# Patient Record
Sex: Female | Born: 1975 | Race: White | Hispanic: No | Marital: Married
Health system: Southern US, Community
[De-identification: ages and names within clinical notes are randomized; demographics above are authoritative.]

## PROBLEM LIST (undated history)

## (undated) DIAGNOSIS — E079 Disorder of thyroid, unspecified: Secondary | ICD-10-CM

## (undated) HISTORY — DX: Disorder of thyroid, unspecified: E07.9

---

## 2008-05-06 ENCOUNTER — Ambulatory Visit (HOSPITAL_BASED_OUTPATIENT_CLINIC_OR_DEPARTMENT_OTHER): Admission: RE | Admit: 2008-05-06 | Discharge: 2008-05-06 | Payer: Self-pay | Admitting: Urology

## 2008-05-06 ENCOUNTER — Encounter (INDEPENDENT_AMBULATORY_CARE_PROVIDER_SITE_OTHER): Payer: Self-pay | Admitting: Urology

## 2010-11-13 NOTE — Op Note (Signed)
Courtney Christensen, Courtney Christensen                ACCOUNT NO.:  [id number]   MEDICAL RECORD NO.:  [id number]          PATIENT TYPE:  AMB   LOCATION:  [facility]                         FACILITY:  [facility]   PHYSICIAN:  Jamison Neighbor, M.D.  DATE OF BIRTH:  11/29/1975   DATE OF PROCEDURE:  05/06/2008  DATE OF DISCHARGE:                               OPERATIVE REPORT   PREOPERATIVE DIAGNOSES:  1. Microscopic hematuria.  2. Possible interstitial cystitis.   POSTOPERATIVE DIAGNOSES:  1. Microscopic hematuria.  2. Possible interstitial cystitis.   PROCEDURE:  Cystoscopy, urethral calibration, hydrodistention of the  bladder, bilateral retrogrades with interpretation, bladder biopsy with  cauterization, Marcaine and Pyridium installation, Marcaine and Kenalog  injection.   SURGEON:  Jamison Neighbor, M.D.   ANESTHESIA:  General.   COMPLICATIONS:  None.   DRAINS:  None.   BRIEF HISTORY:  This 35 year old female has had problems with lower  urinary tract including urgency, frequency and pelvic pain.  The patient  was initially told that she had a small UTI and was given antibiotic  therapy, with no improvement.  The patient still has urgency and  frequency as well as hematuria.  She would like to be evaluated for  interstitial cystitis.   Because of the smoking history, it was felt the patient should undergo  evaluation with cystoscopic examination.  The patient would prefer to  have a hydrodistention.  She will undergo retrogrades to evaluate the  hematuria, bladder biopsies to rule out carcinoma in situ, and  evaluation to determine if she has interstitial cystitis.  She  understands the risks and benefits of the procedure and gave full  informed consent.   PROCEDURE:  After successful induction of general anesthesia, the  patient was placed in the dorsal lithotomy position, prepped with  Betadine, and draped in the usual sterile fashion.  Careful bimanual  examination revealed a normal urethra  with no signs of diverticulum or  suburethral mass.  The urethra was calibrated to 15 [place] with female  urethral sounds, with no signs of stenosis or stricture.  The cystoscope  was inserted.  The bladder was carefully inspected.  Both ureteral  orifices were normal in configuration and location.  Clear urine was  seen to efflux from each.  The bladder was completely unremarkable in  its appearance with the exception that it was perhaps slightly  trabeculated.  She notes that she may have been straining somewhat to  urinate.  There were no tumors or stones seen.  Bilateral retrograde  studies were then performed.  A 5-French ureteral catheter was placed in  the right ureteral orifice and contrast was injected up to the kidney.  The ureter was normal in its appearance with no stricture, obstruction  or filling defect.  The collecting system was also normal with no  filling defect or abnormalities detected.  Drain out films were normal.  A retrograde was done on the opposite side.  The same catheter was  utilized.  Contrast was once again injected and there was once again a  finely-formed collecting system with no evidence of filling  defect,  obstruction, or irregularity of any kind.  Drain out films were also  normal.  Following completion of the bilateral retrogrades, the patient  underwent hydrodistention.  The bladder was distended at a pressure of  100 cm of water for 5 minutes.  When the bladder was drained, there were  no real glomerulations.  There was no ulcer formation.  The bladder  capacity was 800 mL, which while slightly less than the average bladder  capacity of 1150, is better than the average IC bladder capacity of 575.  A biopsy was taken and sent for mast cell analysis.  The biopsy site  will be cauterized.  This will also be used to rule out carcinoma in  situ.  The patient underwent instillation of Marcaine and Pyridium into  the bladder.  Marcaine and Kenalog were  then used for a pudendal block.  The patient tolerated the procedure and was taken to the recovery room  in good condition.  She will be sent home with Lorcet Plus, Pyridium  Plus and doxycycline.  When she returns, we will assess the results of  her hydrodistention.  If the patient has no improvement with the  hydrodistention and if there are no mass cells seen on the biopsy, we  will simply treat her symptomatically and would not consider this to be  a classic case of interstitial cystitis.  If, on the other hand, the  patient does have a response to the hydrodistention or we see  significant mast cells on the biopsy, I would consider this to be a  somewhat atypical case of IC but would treat her in the usual fashion  and would base this decision on the fact that she had slight decrease in  her bladder capacity and the positive response to hydrodistention.  Short of that, however, we will treat this patient simply with  symptomatic therapy such as anticholinergics or urinary analgesics.      Jamison Neighbor, M.D.  Electronically Signed     RJE/MEDQ  D:  05/06/2008  T:  05/06/2008  Job:  [id number]

## 2011-11-16 ENCOUNTER — Encounter (HOSPITAL_BASED_OUTPATIENT_CLINIC_OR_DEPARTMENT_OTHER): Payer: Self-pay | Admitting: *Deleted

## 2011-11-16 ENCOUNTER — Emergency Department (HOSPITAL_BASED_OUTPATIENT_CLINIC_OR_DEPARTMENT_OTHER): Payer: Managed Care, Other (non HMO)

## 2011-11-16 ENCOUNTER — Emergency Department (HOSPITAL_BASED_OUTPATIENT_CLINIC_OR_DEPARTMENT_OTHER)
Admission: EM | Admit: 2011-11-16 | Discharge: 2011-11-17 | Disposition: A | Discharge status: Home / Self Care | Payer: Managed Care, Other (non HMO) | Attending: Emergency Medicine | Admitting: Emergency Medicine

## 2011-11-16 DIAGNOSIS — H53149 Visual discomfort, unspecified: Secondary | ICD-10-CM | POA: Insufficient documentation

## 2011-11-16 DIAGNOSIS — G43909 Migraine, unspecified, not intractable, without status migrainosus: Secondary | ICD-10-CM | POA: Insufficient documentation

## 2011-11-16 DIAGNOSIS — R110 Nausea: Secondary | ICD-10-CM | POA: Insufficient documentation

## 2011-11-16 DIAGNOSIS — E079 Disorder of thyroid, unspecified: Secondary | ICD-10-CM | POA: Insufficient documentation

## 2011-11-16 HISTORY — DX: Disorder of thyroid, unspecified: E07.9

## 2011-11-16 MED ORDER — SODIUM CHLORIDE 0.9 % IV BOLUS (SEPSIS)
1000.0000 mL | Freq: Once | INTRAVENOUS | Status: AC
  Administered 2011-11-16: 1000 mL via INTRAVENOUS

## 2011-11-16 MED ORDER — DEXAMETHASONE SODIUM PHOSPHATE 10 MG/ML IJ SOLN
10.0000 mg | Freq: Once | INTRAMUSCULAR | Status: AC
  Administered 2011-11-16: 10 mg via INTRAVENOUS
  Filled 2011-11-16: qty 1

## 2011-11-16 MED ORDER — METOCLOPRAMIDE HCL 5 MG/ML IJ SOLN
10.0000 mg | Freq: Once | INTRAMUSCULAR | Status: AC
  Administered 2011-11-16: 10 mg via INTRAVENOUS
  Filled 2011-11-16: qty 2

## 2011-11-16 MED ORDER — DIPHENHYDRAMINE HCL 50 MG/ML IJ SOLN
25.0000 mg | Freq: Once | INTRAMUSCULAR | Status: AC
  Administered 2011-11-16: 25 mg via INTRAVENOUS
  Filled 2011-11-16: qty 1

## 2011-11-16 NOTE — ED Notes (Signed)
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

## 2011-11-16 NOTE — ED Notes (Signed)
Vital signs stable. 

## 2011-11-16 NOTE — ED Notes (Signed)
Pt. Reports no visual disturbances at present time.  Pt. Also reports she feels some better.

## 2011-11-16 NOTE — Discharge Instructions (Signed)
Courtney Christensen you had a migraine head ache tonight.  We treated you with IV benadryl, decadron and reglan (migraine cocktail).  Followup with your family doctor to come up with a plan in case the headaches start recurring. Do not drive tonight. Return to the ER for severe pain or stroke symptoms such as weakness on one side. Migraine Headache A migraine is very bad pain on one or both sides of your head. The cause of a migraine is not always known. A migraine can be triggered or caused by different things, such as:  Alcohol.   Smoking.   Stress.   Periods (menstruation) in women.   Aged cheeses.   Foods or drinks that contain nitrates, glutamate, aspartame, or tyramine.   Lack of sleep.   Chocolate.   Caffeine.   Hunger.   Medicines, such as nitroglycerine (used to treat chest pain), birth control pills, estrogen, and some blood pressure medicines.  HOME CARE  Many medicines can help migraine pain or keep migraines from coming back. Your doctor can help you decide on a medicine or treatment program.   If you or your child gets a migraine, it may help to lie down in a dark, quiet room.   Keep a headache journal. This may help find out what is causing the headaches. For example, write down:   What you eat and drink.   How much sleep you get.   Any change to your diet or medicines.  GET HELP RIGHT AWAY IF:   The medicine does not work.   The pain begins again.   The neck is stiff.   You have trouble seeing.   The muscles are weak or you lose muscle control.   You have new symptoms.   You lose your balance.   You have trouble walking.   You feel faint or pass out.  MAKE SURE YOU:   Understand these instructions.   Will watch this condition.   Will get help right away if you are not doing well or get worse.  Document Released: 03/26/2008 Document Revised: 06/06/2011 Document Reviewed: 02/20/2009 [facility] Patient Information 2012 [place], [place].Recurrent  Migraine Headache You have a recurrent migraine headache. The caregiver can usually provide good relief for this headache. If this headache is the same as your previous migraine headaches, it is safe to treat you without repeating a complete evaluation.  These headaches usually have at least two of the following problems:   They occur on one side of the head, pulsate, and are severe enough to prevent daily activities.   They are aggravated by daily physical activities.  You may have one or more of the following symptoms:   Nausea (feeling sick to your stomach).   Vomiting.   Pain with exposure to bright lights or loud noises.  Most headache sufferers have a family history of migraines. Your headaches may also be related to alcohol and smoking habits. Too much sleep, too little sleep, mood, and anxiety may also play a part. Changing some of these triggers may help you lower the number and level of pain of the headaches. Headaches may be related to menses (female menstruation). There are numerous medications that can prevent these headaches. Your caregiver can help you with a medication or regimen (procedure to follow). If this has been a chronic (long-term) condition, the use of long-term narcotics is not recommended. Using long-term narcotics can cause recurrent migraines. Narcotics are only a temporary measure only. They are used for the infrequent  migraine that fails to respond to all other measures. SEEK MEDICAL CARE IF:   You do not get relief from the medications given to you.   You have a recurrence of pain.   This headache begins to differ from past migraine (for example if it is more severe).  SEEK IMMEDIATE MEDICAL CARE IF:  You have a fever.   You have a stiff neck.   You have vision loss or have changes in vision.   You have problems with feeling lightheaded, become faint, or lose your balance.   You have muscular weakness.   You have loss of muscular control.   You  develop severe symptoms different from your first symptoms.   You start losing your balance or have trouble walking.   You feel faint or pass out.  MAKE SURE YOU:   Understand these instructions.   Will watch your condition.   Will get help right away if you are not doing well or get worse.  Document Released: 03/12/2001 Document Revised: 06/06/2011 Document Reviewed: 02/04/2008 [facility] Patient Information 2012 [place], [place].

## 2011-11-16 NOTE — ED Notes (Signed)
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  Pt. LT to take her home is Alfredo Batty [phone]

## 2011-11-16 NOTE — ED Notes (Signed)
Pt. Is actively vomiting at this time and reports feeling better

## 2011-11-16 NOTE — ED Notes (Signed)
Pt with sudden onset of HA and nausea has a hx of migraines. Denies fever or recent illness

## 2011-11-17 NOTE — ED Provider Notes (Signed)
History     CSN: [id number]  Arrival date & time 11/16/11  2012   First MD Initiated Contact with Patient 11/16/11 2133      Chief Complaint  Patient presents with  . Migraine  . Nausea    (Consider location/radiation/quality/duration/timing/severity/associated sxs/prior treatment) Patient is a 36 y.o. female presenting with migraine.  Migraine This is a new problem. The current episode started today. The problem has been unchanged. Associated symptoms include headaches and nausea. Pertinent negatives include no fever, neck pain, numbness, visual change, vomiting or weakness. Exacerbated by: light and sound. She has tried nothing for the symptoms.  Sudden onset of migraine h/a similar to h/a in 2007.  Nausea but no vomiting.  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  Neuro in tact. Photophobia and phonophobia.   Past Medical History  Diagnosis Date  . Migraine   . Thyroid disease     History reviewed. No pertinent past surgical history.  History reviewed. No pertinent family history.  History  Substance Use Topics  . Smoking status: Never Smoker   . Smokeless tobacco: Not on file  . Alcohol Use: No    OB History    Grav Para Term Preterm Abortions TAB SAB Ect Mult Living                  Review of Systems  Constitutional: Negative.  Negative for fever.  HENT: Negative for neck pain.   Eyes: Negative.   Respiratory: Negative.   Cardiovascular: Negative.   Gastrointestinal: Positive for nausea. Negative for vomiting.  Neurological: Positive for headaches. Negative for dizziness, seizures, syncope, speech difficulty, weakness and numbness.  Psychiatric/Behavioral: Negative.   All other systems reviewed and are negative.    Allergies  Review of patient's allergies indicates no known allergies.  Home Medications   Current Outpatient Rx  Name Route Sig Dispense Refill  . CYCLOBENZAPRINE HCL 5 MG PO TABS Oral  Take 5 mg by mouth 3 (three) times daily as needed. For back spasms    . LEVOTHYROXINE SODIUM 50 MCG PO TABS Oral Take 50 mcg by mouth daily.      BP 102/73  Pulse 79  Temp(Src) 97.9 F (36.6 C) (Oral)  Resp 14  Ht 5\' 3"  (1.6 m)  Wt 141 lb (63.957 kg)  BMI 24.98 kg/m2  SpO2 99%  LMP 10/13/2011  Physical Exam  Nursing note and vitals reviewed. Constitutional: She is oriented to person, place, and time. She appears well-developed and well-nourished.  HENT:  Head: Normocephalic.  Eyes: Conjunctivae and EOM are normal. Pupils are equal, round, and reactive to light.  Neck: Normal range of motion. Neck supple.  Cardiovascular: Normal rate.   Pulmonary/Chest: Effort normal.  Abdominal: Soft.  Musculoskeletal: Normal range of motion.  Neurological: She is alert and oriented to person, place, and time. She has normal strength. No cranial nerve deficit or sensory deficit. She displays a negative Romberg sign. GCS eye subscore is 4. GCS verbal subscore is 5. GCS motor subscore is 6.       A & o x 3 MAE=  Normal FOV and coordination.  Skin: Skin is warm and dry.  Psychiatric: She has a normal mood and affect.    ED Course  Procedures (including critical care time)  Labs Reviewed - No data to display Ct Head Wo Contrast  11/16/2011  *RADIOLOGY REPORT*  Clinical Data: Sudden onset headache  CT HEAD WITHOUT CONTRAST  Technique:  Contiguous axial images were obtained from the base of the skull through the vertex without contrast.  Comparison: None.  Findings: There is no evidence for acute hemorrhage, hydrocephalus, mass lesion, or abnormal extra-axial fluid collection.  No definite CT evidence for acute infarction.  Small right maxillary sinus mucous retention cyst.  Otherwise, the visualized paranasal sinuses and mastoid air cells are predominately clear.  IMPRESSION: No acute intracranial abnormality.  Original Report Authenticated By: Waneta Martins, M.D.     1. Migraine        MDM  Migraine h/a treated int he ER with benadryl, decadron and reglan with relief.  CT head unremarkable.  Follow up with pcp next week.  Return if worse.  Neuro intact.         Remi Haggard, NP 11/17/11 1252

## 2011-11-18 NOTE — ED Provider Notes (Signed)
Medical screening examination/treatment/procedure(s) were performed by non-physician practitioner and as supervising physician I was immediately available for consultation/collaboration.   Suzi Roots, MD 11/18/11 [phone]

## 2015-05-04 LAB — TSH: TSH: 2.27 u[IU]/mL (ref 0.41–5.90)

## 2015-09-14 ENCOUNTER — Encounter: Payer: Self-pay | Admitting: *Deleted

## 2015-09-14 ENCOUNTER — Ambulatory Visit (INDEPENDENT_AMBULATORY_CARE_PROVIDER_SITE_OTHER): Payer: Managed Care, Other (non HMO) | Admitting: Endocrinology

## 2015-09-14 ENCOUNTER — Encounter: Payer: Self-pay | Admitting: Endocrinology

## 2015-09-14 VITALS — BP 106/68 | HR 64 | Temp 97.8°F | Resp 14 | Ht 62.5 in | Wt 155.0 lb

## 2015-09-14 DIAGNOSIS — E038 Other specified hypothyroidism: Secondary | ICD-10-CM

## 2015-09-14 DIAGNOSIS — R5383 Other fatigue: Secondary | ICD-10-CM

## 2015-09-14 DIAGNOSIS — E063 Autoimmune thyroiditis: Principal | ICD-10-CM

## 2015-09-14 DIAGNOSIS — N915 Oligomenorrhea, unspecified: Secondary | ICD-10-CM

## 2015-09-14 LAB — CBC WITH DIFFERENTIAL/PLATELET
BASOS ABS: 0 10*3/uL (ref 0.0–0.1)
Basophils Relative: 0.3 % (ref 0.0–3.0)
EOS ABS: 0.2 10*3/uL (ref 0.0–0.7)
Eosinophils Relative: 3.3 % (ref 0.0–5.0)
HEMATOCRIT: 39.9 % (ref 36.0–46.0)
HEMOGLOBIN: 13.3 g/dL (ref 12.0–15.0)
LYMPHS PCT: 31 % (ref 12.0–46.0)
Lymphs Abs: 2.3 10*3/uL (ref 0.7–4.0)
MCHC: 33.3 g/dL (ref 30.0–36.0)
MCV: 88.3 fl (ref 78.0–100.0)
MONOS PCT: 7.4 % (ref 3.0–12.0)
Monocytes Absolute: 0.5 10*3/uL (ref 0.1–1.0)
Neutro Abs: 4.3 10*3/uL (ref 1.4–7.7)
Neutrophils Relative %: 58 % (ref 43.0–77.0)
PLATELETS: 219 10*3/uL (ref 150.0–400.0)
RBC: 4.52 Mil/uL (ref 3.87–5.11)
RDW: 13 % (ref 11.5–15.5)
WBC: 7.4 10*3/uL (ref 4.0–10.5)

## 2015-09-14 LAB — T4, FREE: FREE T4: 1.01 ng/dL (ref 0.60–1.60)

## 2015-09-14 LAB — FOLLICLE STIMULATING HORMONE: FSH: 16.8 m[IU]/mL

## 2015-09-14 LAB — TSH: TSH: 0.96 u[IU]/mL (ref 0.35–4.50)

## 2015-09-14 LAB — VITAMIN B12: Vitamin B-12: 294 pg/mL (ref 211–911)

## 2015-09-14 LAB — T3, FREE: T3 FREE: 3.2 pg/mL (ref 2.3–4.2)

## 2015-09-14 NOTE — Progress Notes (Signed)
Patient ID: Courtney Christensen, female   DOB: 16-Apr-1976, 40 y.o.   MRN: [id number]            Reason for Appointment:  Hypothyroidism, new visit    History of Present Illness:   Hypothyroidism was first diagnosed in 2011  At the time of diagnosis patient was having symptoms of  fatigue, decreased alertness, difficulties with memory, weight gain and some hair loss Although she had gone in for a regular exam only she was told that her blood tests indicated hypothyroidism and was started on 50 g of levothyroxine.  With starting thyroid supplementation the patient's symptoms improved and she was more alert and had improved energy level along with better weight.  She apparently felt fairly good until about middle of 2016 when she was starting to have more symptoms of low energy, sleepiness, difficulty with memory and weight gain as well as hair loss  Since her TSH was 3.27 in 8/16 her dose was increased from 50 up to 75 g; follow-up TSH was 2.27 with normal free T4 She thinks she felt only slightly better with this However with continued symptoms she was told to increase the dose to 88 g about 3 months ago With this she does not feel any better and still continues to have issues with fatigue, sleepiness, some hair loss and decreased memory and alertness and some continued weight gain  She is now coming here for second opinion          Patient's weight history is as follows:  Wt Readings from Last 3 Encounters:  09/14/15 155 lb (70.308 kg)    Thyroid function results have been as follows:  Lab Results  Component Value Date   TSH 2.27 05/04/2015     Past Medical History  Diagnosis Date  . Thyroid disease     History reviewed. No pertinent past surgical history.  Family History  Problem Relation Age of Onset  . Diabetes Mother   . Thyroid disease Mother   . Diabetes Maternal Grandmother   . Thyroid disease Maternal Grandmother     Social History:  reports that she has  quit smoking. She has never used smokeless tobacco. Her alcohol and drug histories are not on file.  Allergies: No Known Allergies    Medication List       This list is accurate as of: 09/14/15  2:52 PM.  Always use your most recent med list.               solifenacin 10 MG tablet  Commonly known as:  VESICARE  Take 10 mg by mouth.     SYNTHROID 88 MCG tablet  Generic drug:  levothyroxine  Take by mouth.        Review of Systems:  Review of Systems  Constitutional: Positive for weight loss and malaise.       25  HENT: Negative for headaches.   Respiratory: Negative for shortness of breath.   Cardiovascular: Negative for leg swelling.  Gastrointestinal: Negative for constipation.  Endocrine: Positive for menstrual changes and cold intolerance. Negative for breast discharge.       Since  about summer of 2016 she has had increasing intervals between her menstrual cycles, generally 6-7 weeks apart.  No hot flashes or sweating episodes. Has had mild chronic cold intolerance, not any worse   Musculoskeletal: Negative for muscle aches.  Skin: Negative for rash.  Neurological: Negative for numbness and tingling.  Psychiatric/Behavioral: Negative for depressed mood and insomnia.  dermoid cyst          Examination:    BP 106/68 mmHg  Pulse 64  Temp(Src) 97.8 F (36.6 C)  Resp 14  Ht 5' 2.5" (1.588 m)  Wt 155 lb (70.308 kg)  BMI 27.88 kg/m2  SpO2 98%  GENERAL:  Average build. She looks well  No pallor, clubbing, lymphadenopathy or edema.  Skin:  no rash or pigmentation.  EYES:  No prominence of the eyes or swelling of the eyelids  ENT: Oral mucosa and tongue normal.  THYROID: Enlarged about 1-1/2-2 times normal, relatively softer and smoother on the right and relatively more irregular on the left without distinct nodules  HEART:  Normal  S1 and S2; no murmur or click.  CHEST:    Lungs: Vescicular breath sounds heard equally.  No crepitations/  wheeze.  ABDOMEN:  No distention.  Liver and spleen not palpable.  No other mass or tenderness.  NEUROLOGICAL: Reflexes are bilaterally normal  at biceps.  JOINTS:  Normal.    Assessment:  HYPOTHYROIDISM, Primary and unknown level of her baseline TSH  She has been given progressively higher doses of Synthroid even though her TSH level has stayed in the normal range She does have a small goiter indicating that she most likely has Hashimoto's thyroiditis.  Even with her TSH being normal at 2.3 on the last labs she continues to complain of fatigue and difficulties with memory Not clear if her symptoms are entirely related to hypothyroidism as she does complain of daytime somnolence also  OLIGOMENORRHEA: Etiology is unclear, she has not had any evaluation for this before   PLAN:   Will check her free T4, T3 and TSH is level again today Consider combination of Synthroid and Cytomel or Armour Thyroid as a trial  Consider doing thyroid ultrasound for her goiter although likely to be Hashimoto's thyroiditis   Will also evaluate her hemoglobin and B12 level as possible cause of her fatigue  Check  FSH  and prolactin to evaluate her oligomenorrhea  Follow-up In 6 weeks   Courtney Christensen 09/14/2015, 2:52 PM

## 2015-09-15 LAB — PROLACTIN: PROLACTIN: 13.8 ng/mL

## 2015-09-26 ENCOUNTER — Other Ambulatory Visit: Payer: Self-pay | Admitting: *Deleted

## 2015-09-26 MED ORDER — LIOTHYRONINE SODIUM 5 MCG PO TABS: 5.0000 ug | ORAL_TABLET | Freq: Every day | ORAL | Status: DC

## 2015-09-26 MED ORDER — LEVOTHYROXINE SODIUM 50 MCG PO TABS: ORAL_TABLET | ORAL | Status: DC

## 2015-10-24 ENCOUNTER — Other Ambulatory Visit (INDEPENDENT_AMBULATORY_CARE_PROVIDER_SITE_OTHER): Payer: Managed Care, Other (non HMO)

## 2015-10-24 DIAGNOSIS — E038 Other specified hypothyroidism: Secondary | ICD-10-CM

## 2015-10-24 DIAGNOSIS — E063 Autoimmune thyroiditis: Principal | ICD-10-CM

## 2015-10-24 LAB — T3, FREE: T3, Free: 3.2 pg/mL (ref 2.3–4.2)

## 2015-10-24 LAB — T4, FREE: Free T4: 0.68 ng/dL (ref 0.60–1.60)

## 2015-10-24 LAB — TSH: TSH: 4.49 u[IU]/mL (ref 0.35–4.50)

## 2015-10-26 ENCOUNTER — Ambulatory Visit (INDEPENDENT_AMBULATORY_CARE_PROVIDER_SITE_OTHER): Payer: Managed Care, Other (non HMO) | Admitting: Endocrinology

## 2015-10-26 ENCOUNTER — Encounter: Payer: Self-pay | Admitting: Endocrinology

## 2015-10-26 VITALS — BP 122/76 | HR 64 | Temp 98.5°F | Resp 14 | Ht 62.5 in | Wt 155.0 lb

## 2015-10-26 DIAGNOSIS — E063 Autoimmune thyroiditis: Principal | ICD-10-CM

## 2015-10-26 DIAGNOSIS — E038 Other specified hypothyroidism: Secondary | ICD-10-CM

## 2015-10-26 MED ORDER — LEVOTHYROXINE SODIUM 125 MCG PO TABS: 62.5000 ug | ORAL_TABLET | Freq: Every day | ORAL | Status: DC

## 2015-10-26 NOTE — Progress Notes (Signed)
Patient ID: Courtney Christensen, female   DOB: 19-Mar-1976, 40 y.o.   MRN: [id number]            Reason for Appointment:  Hypothyroidism, new visit    History of Present Illness:   Hypothyroidism was first diagnosed in 2011  At the time of diagnosis patient was having symptoms of fatigue, decreased alertness, difficulties with memory, weight gain and hair loss She was told that her blood tests indicated hypothyroidism and was started on 50 g of levothyroxine. With starting thyroid supplementation the patient's symptoms improved and she was more alert and had improved energy level along with better weight.  She apparently felt fairly good until about middle of 2016 when she was starting to have more symptoms of low energy, sleepiness, difficulty with memory and weight gain as well as hair loss  Since her TSH was 3.27 in 8/16 her dose was increased from 50 up to 75 g; follow-up TSH was 2.27 with normal free T4 She thinks she felt only slightly better with this However with continued symptoms she was told to increase the dose to 88 g in 11/16   Recent history: She was seen in consultation in 3/17 and despite her normal TSH level she was complaining of fatigue, sleepiness, some hair loss and decreased memory and alertness and some continued weight gain  She was empirically given a trial of Cytomel 5 g daily along with reducing her Synthroid to 50 g. She says that right after her dosage was changed she started feeling improved energy, less sleepiness, better mood, improved alertness and no further weight gain code about         Patient's weight history is as follows:  Wt Readings from Last 3 Encounters:  10/26/15 155 lb (70.308 kg)  09/14/15 155 lb (70.308 kg)    Thyroid function results have been as follows:  Lab Results  Component Value Date   FREET4 0.68 10/24/2015   FREET4 1.01 09/14/2015   TSH 4.49 10/24/2015   TSH 0.96 09/14/2015   TSH 2.27 05/04/2015     Past Medical  History  Diagnosis Date  . Thyroid disease     No past surgical history on file.  Family History  Problem Relation Age of Onset  . Diabetes Mother   . Thyroid disease Mother   . Diabetes Maternal Grandmother   . Thyroid disease Maternal Grandmother     Social History:  reports that she has quit smoking. She has never used smokeless tobacco. Her alcohol and drug histories are not on file.  Allergies: No Known Allergies    Medication List       This list is accurate as of: 10/26/15  9:18 PM.  Always use your most recent med list.               levothyroxine 125 MCG tablet  Commonly known as:  SYNTHROID  Take 0.5 tablets (62.5 mcg total) by mouth daily before breakfast.     liothyronine 5 MCG tablet  Commonly known as:  CYTOMEL  Take 1 tablet (5 mcg total) by mouth daily.     multivitamin with minerals Tabs tablet  Take 1 tablet by mouth daily.     solifenacin 10 MG tablet  Commonly known as:  VESICARE  Take 10 mg by mouth.         Review of Systems  Since  about summer of 2016 she has had increasing intervals between her menstrual cycles, generally 6-7 weeks apart. Prolactin and  FSH are normal         Examination:    BP 122/76 mmHg  Pulse 64  Temp(Src) 98.5 F (36.9 C)  Resp 14  Ht 5' 2.5" (1.588 m)  Wt 155 lb (70.308 kg)  BMI 27.88 kg/m2  SpO2 95%     Assessment:  HYPOTHYROIDISM, Primary and unknown level of her baseline TSH  She has had subjective improvement in her symptoms suggestive of hypothyroidism with using 5 g of Cytomel along with relatively low doses of Synthroid, 50 g Even though she is subjectively doing well her TSH is relatively higher at 4.5 Free T3 is about the same as baseline and free T4 is lower  OLIGOMENORRHEA: Etiology is unclear, she will discuss with gynecologist if menstrual cycles do not improve   PLAN:   Will increase her Synthroid to 62.5 g since her TSH is slightly high and free T4 is low normal She will  follow-up in 3 months    Chayla Shands 10/26/2015, 9:18 PM

## 2016-01-12 ENCOUNTER — Other Ambulatory Visit: Payer: Self-pay | Admitting: Endocrinology

## 2016-01-15 ENCOUNTER — Other Ambulatory Visit: Payer: Self-pay

## 2016-01-15 MED ORDER — LIOTHYRONINE SODIUM 5 MCG PO TABS: 5.0000 ug | ORAL_TABLET | Freq: Every day | ORAL | Status: DC

## 2016-01-16 ENCOUNTER — Other Ambulatory Visit: Payer: Managed Care, Other (non HMO)

## 2016-01-18 ENCOUNTER — Ambulatory Visit: Payer: Managed Care, Other (non HMO) | Admitting: Endocrinology

## 2016-01-30 ENCOUNTER — Other Ambulatory Visit (INDEPENDENT_AMBULATORY_CARE_PROVIDER_SITE_OTHER): Payer: Managed Care, Other (non HMO)

## 2016-01-30 DIAGNOSIS — E038 Other specified hypothyroidism: Secondary | ICD-10-CM

## 2016-01-30 DIAGNOSIS — E063 Autoimmune thyroiditis: Principal | ICD-10-CM

## 2016-01-30 LAB — TSH: TSH: 2.03 u[IU]/mL (ref 0.35–4.50)

## 2016-01-30 LAB — T3, FREE: T3, Free: 3.2 pg/mL (ref 2.3–4.2)

## 2016-01-30 LAB — T4, FREE: Free T4: 0.74 ng/dL (ref 0.60–1.60)

## 2016-01-31 ENCOUNTER — Ambulatory Visit: Payer: Managed Care, Other (non HMO) | Admitting: Endocrinology

## 2016-01-31 ENCOUNTER — Ambulatory Visit (INDEPENDENT_AMBULATORY_CARE_PROVIDER_SITE_OTHER): Payer: Managed Care, Other (non HMO) | Admitting: Endocrinology

## 2016-01-31 ENCOUNTER — Encounter: Payer: Self-pay | Admitting: Endocrinology

## 2016-01-31 VITALS — BP 106/62 | HR 62 | Resp 99 | Ht 62.0 in | Wt 161.0 lb

## 2016-01-31 DIAGNOSIS — E063 Autoimmune thyroiditis: Principal | ICD-10-CM

## 2016-01-31 DIAGNOSIS — E038 Other specified hypothyroidism: Secondary | ICD-10-CM

## 2016-01-31 NOTE — Patient Instructions (Signed)
Same doses

## 2016-01-31 NOTE — Progress Notes (Signed)
Patient ID: Courtney Christensen, female   DOB: Apr 10, 1976, 40 y.o.   MRN: [id number]            Reason for Appointment:  Hypothyroidism, new visit    History of Present Illness:   Hypothyroidism was first diagnosed in 2011  At the time of diagnosis patient was having symptoms of fatigue, decreased alertness, difficulties with memory, weight gain and hair loss She was told that her blood tests indicated hypothyroidism and was started on 50 g of levothyroxine. With starting thyroid supplementation the patient's symptoms improved and she was more alert and had improved energy level along with better weight.  She apparently felt fairly good until about middle of 2016 when she was starting to have more symptoms of low energy, sleepiness, difficulty with memory and weight gain as well as hair loss  Since her TSH was 3.27 in 8/16 her dose was increased from 50 up to 75 g; follow-up TSH was 2.27 with normal free T4 She thinks she felt only slightly better with this However with continued symptoms she was told to increase the dose to 88 g in 11/16   Recent history: She was seen in consultation in 3/17 when despite her normal TSH level she was complaining of fatigue, sleepiness, some hair loss and decreased memory and alertness and some continued weight gain  She was empirically given a trial of Cytomel 5 g daily along with reducing her Synthroid to 50 g. Subsequently she started feeling improved energy, less sleepiness, better mood, improved alertness and no further weight gain code   Since follow-up TSH in 4/16 was 4.5 and free T4 was low-normal her levothyroxine was increased to 62.5 g She thinks she feels a little better with her energy level subsequently also She has gained weight but may have been because of her recent vacation         Patient's weight history is as follows:  Wt Readings from Last 3 Encounters:  01/31/16 161 lb (73 kg)  10/26/15 155 lb (70.3 kg)  09/14/15 155 lb (70.3  kg)    Thyroid function results have been as follows:  Lab Results  Component Value Date   FREET4 0.74 01/30/2016   FREET4 0.68 10/24/2015   FREET4 1.01 09/14/2015   TSH 2.03 01/30/2016   TSH 4.49 10/24/2015   TSH 0.96 09/14/2015     Past Medical History:  Diagnosis Date  . Thyroid disease     No past surgical history on file.  Family History  Problem Relation Age of Onset  . Diabetes Mother   . Thyroid disease Mother   . Diabetes Maternal Grandmother   . Thyroid disease Maternal Grandmother     Social History:  reports that she has quit smoking. She has never used smokeless tobacco. Her alcohol and drug histories are not on file.  Allergies: No Known Allergies    Medication List       Accurate as of 01/31/16  9:41 AM. Always use your most recent med list.          levothyroxine 125 MCG tablet Commonly known as:  SYNTHROID Take 0.5 tablets (62.5 mcg total) by mouth daily before breakfast.   liothyronine 5 MCG tablet Commonly known as:  CYTOMEL Take 1 tablet (5 mcg total) by mouth daily.   multivitamin with minerals Tabs tablet Take 1 tablet by mouth daily.   solifenacin 10 MG tablet Commonly known as:  VESICARE Take 10 mg by mouth.   SUMAtriptan 50 MG tablet  Commonly known as:  IMITREX 1 tablet at headache onset, may repeat once in 2 hours if needed   topiramate 25 MG tablet Commonly known as:  TOPAMAX Take 25-50 mg by mouth.        Review of Systems  Since  about summer of 2016 she Had mild oligomenorrhea with menstrual cycle 6-7 weeks apart However more recently they appear to be only about 30-35 days apart Prolactin and FSH are normal         Examination:    BP 106/62 (BP Location: Left Arm, Patient Position: Sitting, Cuff Size: Normal)   Pulse 62   Resp (!) 99   Ht 5\' 2"  (1.575 m)   Wt 161 lb (73 kg)   BMI 29.45 kg/m   Thyroid not palpable Biceps reflexes appear normal   Assessment:  HYPOTHYROIDISM, Primary   She has  had subjective improvement in her symptoms suggestive of hypothyroidism with using 5 g of Cytomel along with levothyroxine, now taking 62.5 g She feels quite good overall with the changes including on her last visit Her labs are excellent with normal T4 and T3 as well as TSH   OLIGOMENORRHEA: Improving  PLAN:   Continue Synthroid  62.5 g along with Cytomel 5 g Follow-up in 6 months     Janice Bodine 01/31/2016, 9:41 AM

## 2016-02-02 ENCOUNTER — Ambulatory Visit: Payer: Managed Care, Other (non HMO) | Admitting: Endocrinology

## 2016-02-19 ENCOUNTER — Other Ambulatory Visit: Payer: Self-pay | Admitting: Endocrinology

## 2016-05-12 ENCOUNTER — Other Ambulatory Visit: Payer: Self-pay | Admitting: Endocrinology

## 2016-06-20 ENCOUNTER — Other Ambulatory Visit: Payer: Self-pay | Admitting: Endocrinology

## 2016-07-16 ENCOUNTER — Emergency Department (HOSPITAL_COMMUNITY): Payer: Managed Care, Other (non HMO)

## 2016-07-16 ENCOUNTER — Emergency Department (HOSPITAL_COMMUNITY)
Admission: EM | Admit: 2016-07-16 | Discharge: 2016-07-17 | Disposition: A | Discharge status: Home / Self Care | Payer: Managed Care, Other (non HMO) | Attending: Emergency Medicine | Admitting: Emergency Medicine

## 2016-07-16 ENCOUNTER — Encounter (HOSPITAL_COMMUNITY): Payer: Self-pay | Admitting: Emergency Medicine

## 2016-07-16 DIAGNOSIS — W19XXXA Unspecified fall, initial encounter: Secondary | ICD-10-CM

## 2016-07-16 DIAGNOSIS — W108XXA Fall (on) (from) other stairs and steps, initial encounter: Secondary | ICD-10-CM

## 2016-07-16 DIAGNOSIS — Y9301 Activity, walking, marching and hiking: Secondary | ICD-10-CM

## 2016-07-16 DIAGNOSIS — R51 Headache: Secondary | ICD-10-CM

## 2016-07-16 DIAGNOSIS — E039 Hypothyroidism, unspecified: Secondary | ICD-10-CM

## 2016-07-16 DIAGNOSIS — Z79899 Other long term (current) drug therapy: Secondary | ICD-10-CM

## 2016-07-16 DIAGNOSIS — Y999 Unspecified external cause status: Secondary | ICD-10-CM

## 2016-07-16 DIAGNOSIS — M549 Dorsalgia, unspecified: Principal | ICD-10-CM

## 2016-07-16 DIAGNOSIS — Y929 Unspecified place or not applicable: Secondary | ICD-10-CM

## 2016-07-16 NOTE — ED Notes (Addendum)
Pt c/o pain , which is constant. Increasing in severity when moving 10/10 and decreasing with no movements 6/10.  Waiting on MD

## 2016-07-16 NOTE — ED Triage Notes (Signed)
Pt st's she slid and fell down wooden steps yesterday.  Pt c/o pain to right upper back.  Pt also st's she had LOC for short period of time.  Pt c/o headache.

## 2016-07-16 NOTE — ED Provider Notes (Signed)
I saw and evaluated the patient, reviewed the resident's note and I agree with the findings and plan.   EKG Interpretation None     41 year old female here after mechanical 2 days ago. Discharge her head to deny having loss of consciousness. Today she had some intermittent bouts of confusion but no vomiting. She also complains of some right scapular pain. Denies any dyspnea. Suspect concussion will obtain x-rays.   Lorre NickAnthony Deisha Stull, MD 07/16/16 [id number]

## 2016-07-16 NOTE — ED Provider Notes (Signed)
[facility]-EMERGENCY DEPT Provider Note   CSN: [id number] Arrival date & time: 07/16/16  1906     History   Chief Complaint Chief Complaint  Patient presents with  . Fall    HPI Courtney Christensen is a 41 y.o. female.  HPI The patient is a 41 year old female with a medical history of hypothyroidism and migraine who presents after a fall.The patient was walking down the stairs yesterday and slipped hitting her head and tumbling down several stairs. She didn't lose consciousness briefly. Following this fall she had headache back pain and nausea. She did not have any episodes of emesis. She initially took Tylenol for pain control. She went to work today and had intermittent nausea and was told by a coworker that she was acting differently and repeating questions. She continues to have a headache and also endorses 8 out of 10 pain on the right side posteriorly in the midclavicular line slightly inferior to the scapular edge. She denies chest pain or shortness of breath. She denies abdominal pain. She denies diarrhea or constipation. She denies polyuria or dysuria. She denies fevers, night sweats or chills. This fall was mechanical in nature and occurred after her husband put new flooring material on the stairs which made him slippery. She did not have any presyncopal episodes preceding his fall. Past Medical History:  Diagnosis Date  . Thyroid disease     Patient Active Problem List   Diagnosis Date Noted  . 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 Oligomenorrhea 09/14/2015    History reviewed. No pertinent surgical history.  OB History    No data available       Home Medications    Prior to Admission medications   Medication Sig Start Date End Date Taking? Authorizing Provider  levothyroxine (SYNTHROID, LEVOTHROID) 125 MCG tablet TAKE ONE-HALF TABLET BY MOUTH DAILY BEFORE BREAKFAST 06/20/16  Yes Reather Littler, MD  liothyronine (CYTOMEL) 5 MCG tablet Take 1 tablet (5 mcg total) by  mouth daily. 01/15/16   Reather Littler, MD  liothyronine (CYTOMEL) 5 MCG tablet TAKE ONE TABLET BY MOUTH ONCE DAILY 05/13/16   Reather Littler, MD  topiramate (TOPAMAX) 25 MG tablet Take 25-50 mg by mouth. 12/26/15   Historical Provider, MD    Family History Family History  Problem Relation Age of Onset  . Diabetes Mother   . Thyroid disease Mother   . Diabetes Maternal Grandmother   . Thyroid disease Maternal Grandmother     Social History Social History  Substance Use Topics  . Smoking status: Former Games developer  . Smokeless tobacco: Never Used  . Alcohol use Not on file     Allergies   Patient has no known allergies.   Review of Systems Review of Systems  All other systems reviewed and are negative.    Physical Exam Updated Vital Signs BP 101/71   Pulse 61   Temp 98 F (36.7 C) (Oral)   Resp 18   Ht 5\' 2"  (1.575 m)   Wt 72.6 kg   LMP 07/16/2016 (Exact Date)   SpO2 96%   BMI 29.26 kg/m   Physical Exam  Constitutional: She is oriented to person, place, and time. She appears well-developed and well-nourished.  HENT:  Head: Normocephalic and atraumatic.  Eyes: Pupils are equal, round, and reactive to light.  Cardiovascular: Normal rate and regular rhythm.  Exam reveals no friction rub.   No murmur heard. Pulmonary/Chest: Effort normal and breath sounds normal. No respiratory distress. She has no wheezes.  Abdominal: Soft.  Bowel sounds are normal. She exhibits no distension. There is no tenderness.  Musculoskeletal: She exhibits tenderness. She exhibits no edema.  Tenderness on the patient's back on the right side in the midclavicular line just inferior to the scapula and lateral to the vertebral midline.  Neurological: She is alert and oriented to person, place, and time.  No cranial nerve deficit. Strength 5 out of 5 in upper and lower extremity is bilaterally.     ED Treatments / Results  Labs  (all labs ordered are listed, but only abnormal results are  displayed) Labs Reviewed - No data to display  EKG  EKG Interpretation None       Radiology Dg Ribs Bilateral W/chest  Result Date: 07/17/2016 CLINICAL DATA:  41 y/o F; status post fall down 12 stairs with pain in the cysts. EXAM: BILATERAL RIBS AND CHEST - 4+ VIEW COMPARISON:  None. FINDINGS: No fracture or other bone lesions are seen involving the ribs. There is no evidence of pneumothorax or pleural effusion. Both lungs are clear. Heart size and mediastinal contours are within normal limits. IMPRESSION: No acute fracture or dislocation identified. Electronically Signed   By: Mitzi HansenLance  Furusawa-Stratton M.D.   On: 07/17/2016 00:08    Procedures Procedures (including critical care time)  Medications Ordered in ED Medications - No data to display   Initial Impression / Assessment and Plan / ED Course  I have reviewed the triage vital signs and the nursing notes.  Pertinent labs & imaging results that were available during my care of the patient were reviewed by me and considered in my medical decision making (see chart for details).  Clinical Course   Patient presents after mechanical fall with intermittent nausea and headache. She was told by a coworker today that she was repeating questions and not acting herself. Her neurological examination is normal. She also has tenderness to palpation posteriorly on the right side in the midclavicular line inferior to the scapular border and the lateral to the spinous process in the thoracic area. She is afebrile and  unable to stable. She is not requiring pain medication at this time. Because the patient lost consciousness and hit her head we'll proceed with CT head without contrast. Additionally, we will order bilateral diagnostic rib imaging to evaluate for potential fracture. I have also included the chest in these imaging studies to evaluate the sternum anteriorly and posteriorly to evaluate the integrity of the bilateral scapula.Diagnostic  imaging showed no fracture or bone lesion to the ribs. No acute dislocation identified. CT head without contrast shows no acute intracranial under Melody. Patient will be discharged home. We'll recommend ibuprofen and Tylenol as needed for pain.  Final Clinical Impressions(s) / ED Diagnoses   Final diagnoses:  Fall  Fall, initial encounter    New Prescriptions New Prescriptions   No medications on file     Thomasene LotJames Camp Gopal, MD 07/17/16 [phone]

## 2016-07-17 MED ORDER — CYCLOBENZAPRINE HCL 5 MG PO TABS: 5.0000 mg | ORAL_TABLET | Freq: Every evening | ORAL | 0 refills | Status: DC | PRN

## 2016-07-17 NOTE — Discharge Instructions (Signed)
Your x-rays did not show a fracture. Your head CT scan was normal.  For pain continue to use ibuprofen and Tylenol as needed. I suspect you will be extremely sore over the next several days.  If you develop worsening headache, nausea or vomiting please make an appointment with your primary care physician or return to the emergency department.

## 2016-07-29 ENCOUNTER — Other Ambulatory Visit (INDEPENDENT_AMBULATORY_CARE_PROVIDER_SITE_OTHER): Payer: Managed Care, Other (non HMO)

## 2016-07-29 DIAGNOSIS — E063 Autoimmune thyroiditis: Principal | ICD-10-CM

## 2016-07-29 DIAGNOSIS — E038 Other specified hypothyroidism: Secondary | ICD-10-CM

## 2016-07-29 LAB — T4, FREE: Free T4: 0.69 ng/dL (ref 0.60–1.60)

## 2016-07-29 LAB — TSH: TSH: 0.79 u[IU]/mL (ref 0.35–4.50)

## 2016-07-29 LAB — T3, FREE: T3, Free: 3.2 pg/mL (ref 2.3–4.2)

## 2016-07-30 ENCOUNTER — Other Ambulatory Visit: Payer: Managed Care, Other (non HMO)

## 2016-08-02 ENCOUNTER — Ambulatory Visit (INDEPENDENT_AMBULATORY_CARE_PROVIDER_SITE_OTHER): Payer: Managed Care, Other (non HMO) | Admitting: Endocrinology

## 2016-08-02 ENCOUNTER — Telehealth: Payer: Self-pay | Admitting: Endocrinology

## 2016-08-02 ENCOUNTER — Encounter: Payer: Self-pay | Admitting: Endocrinology

## 2016-08-02 VITALS — BP 100/80 | HR 82 | Ht 62.0 in | Wt 159.0 lb

## 2016-08-02 DIAGNOSIS — E038 Other specified hypothyroidism: Secondary | ICD-10-CM

## 2016-08-02 DIAGNOSIS — E063 Autoimmune thyroiditis: Principal | ICD-10-CM

## 2016-08-02 MED ORDER — LEVOTHYROXINE SODIUM 75 MCG PO TABS: 75.0000 ug | ORAL_TABLET | Freq: Every day | ORAL | 3 refills | Status: DC

## 2016-08-02 NOTE — Progress Notes (Signed)
Patient ID: Courtney Christensen, female   DOB: [birth year], 41 y.o.   MRN: [id number]            Reason for Appointment:  Hypothyroidism, new visit    History of Present Illness:   Hypothyroidism was first diagnosed in 2011  At the time of diagnosis patient was having symptoms of fatigue, decreased alertness, difficulties with memory, weight gain and hair loss She was told that her blood tests indicated hypothyroidism and was started on 50 g of levothyroxine. With starting thyroid supplementation the patient's symptoms improved and she was more alert and had improved energy level along with better weight.  She apparently felt fairly good until about middle of 2016 when she was starting to have more symptoms of low energy, sleepiness, difficulty with memory and weight gain as well as hair loss  Since her TSH was 3.27 in 8/16 her dose was increased from 50 up to 75 g; follow-up TSH was 2.27 with normal free T4 She thinks she felt only slightly better with this However with continued symptoms she was told to increase the dose to 88 g in 11/16   Recent history: She was seen in consultation in 3/17 when despite her normal TSH level she was complaining of fatigue, sleepiness, some hair loss and decreased memory and alertness and some continued weight gain  She was empirically given a trial of Cytomel 5 g daily along with reducing her Synthroid to 50 g. Subsequently she started feeling improved energy, less sleepiness, better mood, improved alertness and no further weight gain code   Since follow-up TSH in 4/16 was 4.5 and free T4 was low-normal her levothyroxine was increased to 62.5 g  RECENT history: She does think she feels a little tired recently but she does work long hours and may be not being consistent with exercise No difficulty with sleepiness or decreased alertness She has a pillbox and takes her medication regularly However with the current generic medication she has difficulty  cutting the tablets in half evenly and may not get all of the medication         Patient's weight history is as follows:  Wt Readings from Last 3 Encounters:  08/02/16 159 lb (72.1 kg)  07/16/16 160 lb (72.6 kg)  01/31/16 161 lb (73 kg)    Thyroid function results have been as follows:  Lab Results  Component Value Date   FREET4 0.69 07/29/2016   FREET4 0.74 01/30/2016   FREET4 0.68 10/24/2015   TSH 0.79 07/29/2016   TSH 2.03 01/30/2016   TSH 4.49 10/24/2015     Past Medical History:  Diagnosis Date  . Thyroid disease     No past surgical history on file.  Family History  Problem Relation Age of Onset  . Diabetes Mother   . Thyroid disease Mother   . Diabetes Maternal Grandmother   . Thyroid disease Maternal Grandmother     Social History:  reports that she has quit smoking. She has never used smokeless tobacco. Her alcohol and drug histories are not on file.  Allergies: No Known Allergies  Allergies as of 08/02/2016   No Known Allergies     Medication List       Accurate as of 08/02/16  9:41 AM. Always use your most recent med list.          cyclobenzaprine 5 MG tablet Commonly known as:  FLEXERIL Take 1 tablet (5 mg total) by mouth at bedtime as needed for muscle spasms.  levothyroxine 125 MCG tablet Commonly known as:  SYNTHROID, LEVOTHROID TAKE ONE-HALF TABLET BY MOUTH DAILY BEFORE BREAKFAST   liothyronine 5 MCG tablet Commonly known as:  CYTOMEL Take 1 tablet (5 mcg total) by mouth daily.   liothyronine 5 MCG tablet Commonly known as:  CYTOMEL TAKE ONE TABLET BY MOUTH ONCE DAILY        Review of Systems  Since  about summer of 2016 she Had mild oligomenorrhea with menstrual cycle Longer part More recently they are now 4 weeks apart         Examination:    BP 100/80   Pulse 82   Ht 5\' 2"  (1.575 m)   Wt 159 lb (72.1 kg)   LMP 07/16/2016 (Exact Date)   SpO2 98%   BMI 29.08 kg/m   Exam not  indicated   Assessment:  HYPOTHYROIDISM, Primary   She has had subjective improvement in her symptoms suggestive of hypothyroidism with using 5 g of Cytomel along with levothyroxine, now taking 62.5 g She feels quite good overall and her fatigue at times may be nonspecific related to her work hours or other factors Her labs are excellent with normal T4 and T3 as well as TSH  She has difficulty getting the tablet to cut in half for the Synthroid  OLIGOMENORRHEA: Resolved  PLAN:   Continue Synthroid but will change her regimen to 75 g, 6 days a week Continue Cytomel 5 g, she can take both tablets together Follow-up in 6 months     Nefi Musich 08/02/2016, 9:41 AM

## 2016-08-02 NOTE — Patient Instructions (Signed)
Skip 1 day on 75 dose

## 2016-08-02 NOTE — Telephone Encounter (Signed)
It has been removed.

## 2016-08-02 NOTE — Telephone Encounter (Signed)
Patient asked you to take medication   cyclobenzaprine (FLEXERIL) 5 MG tablet  Off her med list, she no longer take it

## 2016-09-16 ENCOUNTER — Other Ambulatory Visit: Payer: Self-pay | Admitting: Endocrinology

## 2017-01-18 ENCOUNTER — Other Ambulatory Visit: Payer: Self-pay | Admitting: Endocrinology

## 2017-01-21 ENCOUNTER — Other Ambulatory Visit: Payer: Self-pay

## 2017-01-21 MED ORDER — LIOTHYRONINE SODIUM 5 MCG PO TABS: 5.0000 ug | ORAL_TABLET | Freq: Every day | ORAL | 3 refills | Status: DC

## 2017-02-14 ENCOUNTER — Other Ambulatory Visit (INDEPENDENT_AMBULATORY_CARE_PROVIDER_SITE_OTHER): Payer: Managed Care, Other (non HMO)

## 2017-02-14 DIAGNOSIS — E063 Autoimmune thyroiditis: Principal | ICD-10-CM

## 2017-02-14 LAB — T4, FREE: Free T4: 0.9 ng/dL (ref 0.60–1.60)

## 2017-02-14 LAB — TSH: TSH: 0.8 u[IU]/mL (ref 0.35–4.50)

## 2017-02-14 LAB — T3, FREE: T3, Free: 3.1 pg/mL (ref 2.3–4.2)

## 2017-02-17 ENCOUNTER — Other Ambulatory Visit: Payer: Managed Care, Other (non HMO)

## 2017-02-17 NOTE — Progress Notes (Signed)
Patient ID: Courtney Christensen, female   DOB: 05-Jul-1975, 41 y.o.   MRN: [id number]            Reason for Appointment:  Hypothyroidism, new visit    History of Present Illness:   Hypothyroidism was first diagnosed in 2011  At the time of diagnosis patient was having symptoms of fatigue, decreased alertness, difficulties with memory, weight gain and hair loss She was told that her blood tests indicated hypothyroidism and was started on 50 g of levothyroxine. With starting thyroid supplementation the patient's symptoms improved and she was more alert and had improved energy level along with better weight.  She apparently felt fairly good until about middle of 2016 when she was starting to have more symptoms of low energy, sleepiness, difficulty with memory and weight gain as well as hair loss  Since her TSH was 3.27 in 8/16 her dose was increased from 50 up to 75 g; follow-up TSH was 2.27 with normal free T4 She thinks she felt only slightly better with this However with continued symptoms she was told to increase the dose to 88 g in 11/16   She was seen in consultation in 3/17 when despite her normal TSH level she was complaining of fatigue, sleepiness, some hair loss and decreased memory and alertness and some continued weight gain  She has been on Cytomel 5 g daily along with variable doses of levothyroxine since then Subsequently she started feeling improved energy, less sleepiness, better mood, improved alertness and no further weight gain     RECENT history:  Although her labs were fairly good on her last visit she had been complaining of feeling somewhat tired Also because of her difficulty with cutting the 125 g tablet in half she was changed to the 75 g, 6 days a week  She reported that she was feeling more tired and sleepy in the afternoon with the new dosage regimen above for about a month On her own she started taking the levothyroxine 7 days a week along with her  Cytomel Since then she thinks she has started feeling better and his having much more energy  However she also has changed her diet significantly and has been progressively losing weight Recently no complaints of decreased alertness or fatigue She takes her medications consistently at 6 AM  Her thyroid levels are fairly similar to the ones on her last visit         Patient's weight history is as follows:  Wt Readings from Last 3 Encounters:  02/18/17 139 lb 9.6 oz (63.3 kg)  08/02/16 159 lb (72.1 kg)  07/16/16 160 lb (72.6 kg)    Thyroid function results have been as follows:  Lab Results  Component Value Date   FREET4 0.90 02/14/2017   FREET4 0.69 07/29/2016   FREET4 0.74 01/30/2016   TSH 0.80 02/14/2017   TSH 0.79 07/29/2016   TSH 2.03 01/30/2016   Lab Results  Component Value Date   T3FREE 3.1 02/14/2017   T3FREE 3.2 07/29/2016   T3FREE 3.2 01/30/2016     Past Medical History:  Diagnosis Date  . Thyroid disease     No past surgical history on file.  Family History  Problem Relation Age of Onset  . Diabetes Mother   . Thyroid disease Mother   . Diabetes Maternal Grandmother   . Thyroid disease Maternal Grandmother     Social History:  reports that she has been smoking.  She has never used smokeless tobacco. Her  alcohol and drug histories are not on file.  Allergies: No Known Allergies  Allergies as of 02/18/2017   No Known Allergies     Medication List       Accurate as of 02/18/17  8:05 AM. Always use your most recent med list.          cyclobenzaprine 5 MG tablet Commonly known as:  FLEXERIL Take 1 tablet (5 mg total) by mouth at bedtime as needed for muscle spasms.   levothyroxine 75 MCG tablet Commonly known as:  SYNTHROID, LEVOTHROID Take 1 tablet (75 mcg total) by mouth daily before breakfast.   liothyronine 5 MCG tablet Commonly known as:  CYTOMEL Take 1 tablet (5 mcg total) by mouth daily.        Review of Systems  She feels  a little lightheaded around the time of her menstrual cycle, this is not new No change in appetite or nausea, she thinks her blood pressure tends to be low normal during her cycles        Examination:    BP (!) 88/60   Pulse 62   Ht 5\' 2"  (1.575 m)   Wt 139 lb 9.6 oz (63.3 kg)   SpO2 98%   BMI 25.53 kg/m     Assessment:  HYPOTHYROIDISM, Primary   She is doing subjectively very well with treatment of her hypothyroidism with using 5 g of Cytomel along with levothyroxine, now taking 75 g daily  She has lost weight with changes in her diet Has been taking them medication consistently Although she was told to take the 75 g dosage 6 days a week her labs are still about the same as with previous regimen of 62.5 g Her levels are excellent with normal T4 and T3 as well as TSH   Low normal blood pressure: She is not orthostatic and has no other symptoms suggestive of adrenal insufficiency, will continue to follow  Weight loss: This appears to be voluntary and she is subjectively doing well  PLAN:   Continue Synthroid 75 g for now Continue Cytomel 5 g, she can take both tablets together Follow-up in 6 months  Total visit time 15 minutes   Mataeo Ingwersen 02/18/2017, 8:05 AM

## 2017-02-18 ENCOUNTER — Encounter: Payer: Self-pay | Admitting: Endocrinology

## 2017-02-18 ENCOUNTER — Ambulatory Visit (INDEPENDENT_AMBULATORY_CARE_PROVIDER_SITE_OTHER): Payer: Managed Care, Other (non HMO) | Admitting: Endocrinology

## 2017-02-18 VITALS — BP 95/70 | HR 62 | Ht 62.0 in | Wt 139.6 lb

## 2017-02-18 DIAGNOSIS — R634 Abnormal weight loss: Secondary | ICD-10-CM

## 2017-02-18 DIAGNOSIS — E063 Autoimmune thyroiditis: Principal | ICD-10-CM

## 2017-02-18 MED ORDER — LIOTHYRONINE SODIUM 5 MCG PO TABS: 5.0000 ug | ORAL_TABLET | Freq: Every day | ORAL | 2 refills | Status: DC

## 2017-02-20 ENCOUNTER — Ambulatory Visit: Payer: Managed Care, Other (non HMO) | Admitting: Endocrinology

## 2017-03-04 DIAGNOSIS — G43919 Migraine, unspecified, intractable, without status migrainosus: Secondary | ICD-10-CM

## 2017-03-04 DIAGNOSIS — F331 Major depressive disorder, recurrent, moderate: Secondary | ICD-10-CM

## 2017-05-15 ENCOUNTER — Other Ambulatory Visit: Payer: Self-pay

## 2017-05-15 MED ORDER — LEVOTHYROXINE SODIUM 75 MCG PO TABS: 75.0000 ug | ORAL_TABLET | Freq: Every day | ORAL | 3 refills | Status: DC

## 2017-05-15 MED ORDER — LIOTHYRONINE SODIUM 5 MCG PO TABS: 5.0000 ug | ORAL_TABLET | Freq: Every day | ORAL | 2 refills | Status: DC

## 2017-08-19 ENCOUNTER — Other Ambulatory Visit: Payer: Managed Care, Other (non HMO)

## 2017-08-21 ENCOUNTER — Ambulatory Visit: Payer: Managed Care, Other (non HMO) | Admitting: Endocrinology

## 2017-09-08 ENCOUNTER — Other Ambulatory Visit (INDEPENDENT_AMBULATORY_CARE_PROVIDER_SITE_OTHER): Payer: Managed Care, Other (non HMO)

## 2017-09-08 DIAGNOSIS — E063 Autoimmune thyroiditis: Principal | ICD-10-CM

## 2017-09-08 LAB — T4, FREE: FREE T4: 0.72 ng/dL (ref 0.60–1.60)

## 2017-09-08 LAB — TSH: TSH: 2.23 u[IU]/mL (ref 0.35–4.50)

## 2017-09-08 LAB — T3, FREE: T3, Free: 2.9 pg/mL (ref 2.3–4.2)

## 2017-09-09 NOTE — Progress Notes (Signed)
Patient ID: Courtney Christensen, female   DOB: Nov 25, 1975, 42 y.o.   MRN: [id number]            Reason for Appointment:  Hypothyroidism, new visit    History of Present Illness:   Hypothyroidism was first diagnosed in 2011  At the time of diagnosis patient was having symptoms of fatigue, decreased alertness, difficulties with memory, weight gain and hair loss She was told that her blood tests indicated hypothyroidism and was started on 50 g of levothyroxine. With starting thyroid supplementation the patient's symptoms improved and she was more alert and had improved energy level along with better weight.  She apparently felt fairly good until about middle of 2016 when she was starting to have more symptoms of low energy, sleepiness, difficulty with memory and weight gain as well as hair loss  Since her TSH was 3.27 in 8/16 her dose was increased from 50 up to 75 g; follow-up TSH was 2.27 with normal free T4 She thinks she felt only slightly better with this However with continued symptoms she was told to increase the dose to 88 g in 11/16   She was seen in consultation in 3/17 when despite her normal TSH level she was complaining of fatigue, sleepiness, some hair loss and decreased memory and alertness and some continued weight gain  She has been on Cytomel 5 g daily along with variable doses of levothyroxine since then Subsequently she started feeling improved energy, less sleepiness, better mood, improved alertness and no further weight gain     RECENT history:  She is back for her 7163-month visit Has been on a regimen of levothyroxine 75 mcg along with Cytomel 5 mcg daily She generally takes these before breakfast in the morning although over the last month or so may have missed a couple of doses because of family issues However she does not think she has had a decrease in her energy level, concentration or any hair loss She has maintained her weight down with watching her diet  consistently and also being active  Her thyroid levels are in the normal range although slightly lower than before especially free T4         Patient's weight history is as follows:  Wt Readings from Last 3 Encounters:  09/10/17 137 lb (62.1 kg)  02/18/17 139 lb 9.6 oz (63.3 kg)  08/02/16 159 lb (72.1 kg)    Thyroid function results have been as follows:  Lab Results  Component Value Date   FREET4 0.72 09/08/2017   FREET4 0.90 02/14/2017   FREET4 0.69 07/29/2016   TSH 2.23 09/08/2017   TSH 0.80 02/14/2017   TSH 0.79 07/29/2016   Lab Results  Component Value Date   T3FREE 2.9 09/08/2017   T3FREE 3.1 02/14/2017   T3FREE 3.2 07/29/2016     Past Medical History:  Diagnosis Date  . Thyroid disease     History reviewed. No pertinent surgical history.  Family History  Problem Relation Age of Onset  . Diabetes Mother   . Thyroid disease Mother   . Diabetes Maternal Grandmother   . Thyroid disease Maternal Grandmother     Social History:  reports that she has been smoking.  she has never used smokeless tobacco. Her alcohol and drug histories are not on file.  Allergies: No Known Allergies  Allergies as of 09/10/2017   No Known Allergies     Medication List        Accurate as of 09/10/17  9:42  AM. Always use your most recent med list.          cyclobenzaprine 5 MG tablet Commonly known as:  FLEXERIL Take 1 tablet (5 mg total) by mouth at bedtime as needed for muscle spasms.   levothyroxine 75 MCG tablet Commonly known as:  SYNTHROID, LEVOTHROID Take 1 tablet (75 mcg total) daily before breakfast by mouth.   liothyronine 5 MCG tablet Commonly known as:  CYTOMEL Take 1 tablet (5 mcg total) daily by mouth.   sertraline 50 MG tablet Commonly known as:  ZOLOFT Take 1 tablet by mouth daily.        Review of Systems  No change in menstrual cycles        Examination:    BP 104/80 (BP Location: Left Arm, Patient Position: Sitting, Cuff Size: Normal)    Pulse (!) 58   Ht 5\' 2"  (1.575 m)   Wt 137 lb (62.1 kg)   SpO2 95%   BMI 25.06 kg/m   Thyroid nonpalpable Left side biceps reflex appears normal  Assessment:  HYPOTHYROIDISM, Primary   She is on Cytomel and Synthroid She is subjectively doing very well Despite her weight loss over the last year she is still requiring about the same amount of supplementation Thyroid not enlarged Explained to the patient the nature of autoimmune thyroid disease  PLAN:   Continue Synthroid 75 g daily along with 5 mcg of liothyronine in the mornings She will try to be regular with her supplements  Follow-up in 6 months    Onyinyechi Huante 09/10/2017, 9:42 AM

## 2017-09-10 ENCOUNTER — Encounter: Payer: Self-pay | Admitting: Endocrinology

## 2017-09-10 ENCOUNTER — Ambulatory Visit (INDEPENDENT_AMBULATORY_CARE_PROVIDER_SITE_OTHER): Payer: Managed Care, Other (non HMO) | Admitting: Endocrinology

## 2017-09-10 VITALS — BP 104/80 | HR 58 | Ht 62.0 in | Wt 137.0 lb

## 2017-09-10 DIAGNOSIS — E063 Autoimmune thyroiditis: Principal | ICD-10-CM

## 2018-01-27 ENCOUNTER — Telehealth: Payer: Self-pay | Admitting: Emergency Medicine

## 2018-01-27 ENCOUNTER — Other Ambulatory Visit: Payer: Self-pay

## 2018-01-27 MED ORDER — LEVOTHYROXINE SODIUM 75 MCG PO TABS: 75.0000 ug | ORAL_TABLET | Freq: Every day | ORAL | 0 refills | Status: DC

## 2018-01-27 MED ORDER — LIOTHYRONINE SODIUM 5 MCG PO TABS: 5.0000 ug | ORAL_TABLET | Freq: Every day | ORAL | 0 refills | Status: DC

## 2018-01-27 NOTE — Telephone Encounter (Addendum)
Pt called and is going to run out of her prescription for liothyronine (CYTOMEL) 5 MCG tablet and levothyroxine (SYNTHROID, LEVOTHROID) 75 MCG tablet before her appt and would like a refill until then please. Pharmacy is [facility] [place]. Thanks.

## 2018-01-27 NOTE — Telephone Encounter (Signed)
Rx sent 

## 2018-03-05 ENCOUNTER — Other Ambulatory Visit (INDEPENDENT_AMBULATORY_CARE_PROVIDER_SITE_OTHER): Payer: Managed Care, Other (non HMO)

## 2018-03-05 DIAGNOSIS — E063 Autoimmune thyroiditis: Principal | ICD-10-CM

## 2018-03-05 LAB — T3, FREE: T3, Free: 3.3 pg/mL (ref 2.3–4.2)

## 2018-03-05 LAB — T4, FREE: FREE T4: 0.72 ng/dL (ref 0.60–1.60)

## 2018-03-05 LAB — TSH: TSH: 2.51 u[IU]/mL (ref 0.35–4.50)

## 2018-03-10 ENCOUNTER — Other Ambulatory Visit: Payer: Managed Care, Other (non HMO)

## 2018-03-13 ENCOUNTER — Ambulatory Visit: Payer: Managed Care, Other (non HMO) | Admitting: Endocrinology

## 2018-03-13 ENCOUNTER — Encounter: Payer: Self-pay | Admitting: Endocrinology

## 2018-03-13 VITALS — BP 100/64 | HR 61 | Temp 98.7°F | Ht 62.0 in | Wt 142.8 lb

## 2018-03-13 DIAGNOSIS — Z23 Encounter for immunization: Secondary | ICD-10-CM

## 2018-03-13 DIAGNOSIS — E063 Autoimmune thyroiditis: Principal | ICD-10-CM

## 2018-03-13 MED ORDER — LIOTHYRONINE SODIUM 5 MCG PO TABS: 5.0000 ug | ORAL_TABLET | Freq: Every day | ORAL | 1 refills | Status: DC

## 2018-03-13 MED ORDER — LEVOTHYROXINE SODIUM 75 MCG PO TABS
75.0000 ug | ORAL_TABLET | Freq: Every day | ORAL | 1 refills | Status: DC
Start: 2018-03-13 — End: 2018-08-24

## 2018-03-13 NOTE — Progress Notes (Signed)
Patient ID: Courtney Christensen, female   DOB: [birth year], 42 y.o.   MRN: [id number]            Reason for Appointment:  Hypothyroidism, f/u visit    History of Present Illness:   Hypothyroidism was first diagnosed in 2011  At the time of diagnosis patient was having symptoms of fatigue, decreased alertness, difficulties with memory, weight gain and hair loss She was told that her blood tests indicated hypothyroidism and was started on 50 g of levothyroxine. With starting thyroid supplementation the patient's symptoms improved and she was more alert and had improved energy level along with better weight.  She apparently felt fairly good until about middle of 2016 when she was starting to have more symptoms of low energy, sleepiness, difficulty with memory and weight gain as well as hair loss  Since her TSH was 3.27 in 8/16 her dose was increased from 50 up to 75 g; follow-up TSH was 2.27 with normal free T4 She thinks she felt only slightly better with this   She was seen in consultation in 3/17 when despite her normal TSH level she was complaining of fatigue, sleepiness, some hair loss and decreased memory and alertness and some continued weight gain  RECENT history: She has been on Cytomel 5 g daily along with 75 mcg of levothyroxine since then Although generally she has been feeling fairly good recently 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 she has had some more fatigue and weight gain from less activity No change in mood or concentration  She takes her levothyroxine in the morning without food and does that around the same time regardless of whether she is working days or nights  Her thyroid levels are in the normal range and about the same as before   Patient's weight history is as follows:  Wt Readings from Last 3 Encounters:  03/13/18 142 lb 12.8 oz (64.8 kg)  09/10/17 137 lb (62.1 kg)  02/18/17 139 lb 9.6 oz (63.3 kg)    Thyroid function results have been as follows:  Lab  Results  Component Value Date   TSH 2.51 03/05/2018   TSH 2.23 09/08/2017   TSH 0.80 02/14/2017   FREET4 0.72 03/05/2018   FREET4 0.72 09/08/2017   FREET4 0.90 02/14/2017    Lab Results  Component Value Date   T3FREE 3.3 03/05/2018   T3FREE 2.9 09/08/2017   T3FREE 3.1 02/14/2017     Past Medical History:  Diagnosis Date  . Thyroid disease     No past surgical history on file.  Family History  Problem Relation Age of Onset  . Diabetes Mother   . Thyroid disease Mother   . Diabetes Maternal Grandmother   . Thyroid disease Maternal Grandmother     Social History:  reports that she has been smoking. She has never used smokeless tobacco. Her alcohol and drug histories are not on file.  Allergies: No Known Allergies  Allergies as of 03/13/2018   No Known Allergies     Medication List        Accurate as of 03/13/18  8:17 AM. Always use your most recent med list.          buPROPion 150 MG 24 hr tablet Commonly known as:  WELLBUTRIN XL Take 1 tablet by mouth daily.   cyclobenzaprine 5 MG tablet Commonly known as:  FLEXERIL Take 1 tablet (5 mg total) by mouth at bedtime as needed for muscle spasms.   levothyroxine 75 MCG tablet Commonly known  as:  SYNTHROID, LEVOTHROID Take 1 tablet (75 mcg total) by mouth daily before breakfast.   liothyronine 5 MCG tablet Commonly known as:  CYTOMEL Take 1 tablet (5 mcg total) by mouth daily.   sertraline 50 MG tablet Commonly known as:  ZOLOFT Take 1 tablet by mouth daily.        Review of Systems     Examination:    BP 100/64 (BP Location: Left Arm, Patient Position: Sitting, Cuff Size: Normal)   Pulse 61   Temp 98.7 F (37.1 C) (Oral)   Ht 5\' 2"  (1.575 m)   Wt 142 lb 12.8 oz (64.8 kg)   SpO2 98%   BMI 26.12 kg/m   Thyroid just palpable on the right lower lateral pole area, relatively firm and about 1 cm across, felt with difficulty on swallowing Left side is not palpable Biceps reflexes normal No  alopecia Skin normal  Assessment:  HYPOTHYROIDISM, Primary   She is on Cytomel 5 mcg and Synthroid which subjectively has helped her compared to Synthroid alone in the past She is consistent with taking her medication She has had some weight gain and more fatigue because of change in her job situation  On exam there is an indistinct right lower pole firm area palpable, likely to be remnant of her previous small goiter or scar tissue  PLAN:   Continue Synthroid 75 g daily along with 5 mcg of liothyronine in the mornings  Reexamine her in 6 months for any change in the right lobe  90-day prescriptions have been sent  Flu vaccine given  Follow-up in 6 months    Quoc Tome 03/13/2018, 8:17 AM

## 2018-08-23 ENCOUNTER — Other Ambulatory Visit: Payer: Self-pay | Admitting: Endocrinology

## 2018-09-09 ENCOUNTER — Other Ambulatory Visit: Payer: Self-pay

## 2018-09-09 ENCOUNTER — Other Ambulatory Visit (INDEPENDENT_AMBULATORY_CARE_PROVIDER_SITE_OTHER): Payer: Managed Care, Other (non HMO)

## 2018-09-09 DIAGNOSIS — E063 Autoimmune thyroiditis: Principal | ICD-10-CM

## 2018-09-09 LAB — T4, FREE: FREE T4: 0.65 ng/dL (ref 0.60–1.60)

## 2018-09-09 LAB — T3, FREE: T3 FREE: 3.1 pg/mL (ref 2.3–4.2)

## 2018-09-09 LAB — TSH: TSH: 5.7 u[IU]/mL — AB (ref 0.35–4.50)

## 2018-09-11 ENCOUNTER — Ambulatory Visit: Payer: Managed Care, Other (non HMO) | Admitting: Endocrinology

## 2018-09-11 ENCOUNTER — Encounter: Payer: Self-pay | Admitting: Endocrinology

## 2018-09-11 ENCOUNTER — Other Ambulatory Visit: Payer: Self-pay

## 2018-09-11 VITALS — BP 112/74 | HR 84 | Temp 98.6°F | Ht 62.0 in | Wt 154.6 lb

## 2018-09-11 DIAGNOSIS — E063 Autoimmune thyroiditis: Principal | ICD-10-CM

## 2018-09-11 MED ORDER — LEVOTHYROXINE SODIUM 88 MCG PO TABS: 88.0000 ug | ORAL_TABLET | Freq: Every day | ORAL | 3 refills | Status: DC

## 2018-09-11 NOTE — Progress Notes (Signed)
Patient ID: Courtney Christensen, female   DOB: 03/20/1976, 43 y.o.   MRN: [id number]            Reason for Appointment:  Hypothyroidism, f/u visit    History of Present Illness:   Hypothyroidism was first diagnosed in 2011  At the time of diagnosis patient was having symptoms of fatigue, decreased alertness, difficulties with memory, weight gain and hair loss She was told that her blood tests indicated hypothyroidism and was started on 50 g of levothyroxine. With starting thyroid supplementation the patient's symptoms improved and she was more alert and had improved energy level along with better weight.  She apparently felt fairly good until about middle of 2016 when she was starting to have more symptoms of low energy, sleepiness, difficulty with memory and weight gain as well as hair loss  Since her TSH was 3.27 in 8/16 her dose was increased from 50 up to 75 g; follow-up TSH was 2.27 with normal free T4 She thinks she felt only slightly better with this   She was seen in consultation in 3/17 when despite her normal TSH level she was complaining of fatigue, sleepiness, some hair loss and decreased memory and alertness and some continued weight gain  RECENT history: She has been on Cytomel 5 g daily along with 75 mcg of levothyroxine since 3/17 She was last seen 6 months ago  She appears to be progressively gaining weight She thinks that this is despite her trying to go to the gym to exercise 3 to 4 days a week now No change in other medications She does think that she feels a little more tired and definitely appears to be colder at work  She takes her levothyroxine in the morning before breakfast and no calcium or iron at the same time  Her TSH is higher and free T4 is relatively lower than before   Patient's weight history is as follows:  Wt Readings from Last 3 Encounters:  09/11/18 154 lb 9.6 oz (70.1 kg)  03/13/18 142 lb 12.8 oz (64.8 kg)  09/10/17 137 lb (62.1 kg)     Thyroid function results have been as follows:  Lab Results  Component Value Date   TSH 5.70 (H) 09/09/2018   TSH 2.51 03/05/2018   TSH 2.23 09/08/2017   FREET4 0.65 09/09/2018   FREET4 0.72 03/05/2018   FREET4 0.72 09/08/2017    Lab Results  Component Value Date   T3FREE 3.1 09/09/2018   T3FREE 3.3 03/05/2018   T3FREE 2.9 09/08/2017     Past Medical History:  Diagnosis Date  . Thyroid disease     History reviewed. No pertinent surgical history.  Family History  Problem Relation Age of Onset  . Diabetes Mother   . Thyroid disease Mother   . Diabetes Maternal Grandmother   . Thyroid disease Maternal Grandmother     Social History:  reports that she has been smoking. She has never used smokeless tobacco. No history on file for alcohol and drug.  Allergies: No Known Allergies  Allergies as of 09/11/2018   No Known Allergies     Medication List       Accurate as of September 11, 2018  8:03 AM. Always use your most recent med list.        levothyroxine 75 MCG tablet Commonly known as:  SYNTHROID, LEVOTHROID TAKE 1 TABLET BY MOUTH DAILY BEFORE BREAKFAST   liothyronine 5 MCG tablet Commonly known as:  CYTOMEL TAKE 1 TABLET BY MOUTH  DAILY   sertraline 50 MG tablet Commonly known as:  ZOLOFT Take 1 tablet by mouth daily.        Review of Systems  Has been on sertraline since at least 2019   Examination:    BP 112/74 (BP Location: Left Arm, Patient Position: Sitting, Cuff Size: Normal)   Pulse 84   Temp 98.6 F (37 C) (Oral)   Ht 5\' 2"  (1.575 m)   Wt 154 lb 9.6 oz (70.1 kg)   SpO2 98%   BMI 28.28 kg/m   She does not look cushingoid  Thyroid right lobe previous barely palpable on swallowing slightly firm without any distinct nodule No neck lymphadenopathy Left side is not palpable Biceps reflexes normal Skin appears normal  Assessment:  HYPOTHYROIDISM, Primary   She is on Cytomel 5 mcg and Synthroid 75 Although she had done well with  this before she appears to be getting more hypothyroid She has mild symptoms along with TSH of 5.7 Free T4 is low normal  Again having some progressive weight gain and not clear if this is related to her sedentary job  Minimal thyroid enlargement present PLAN:   She will now take 88 mcg levothyroxine same dose of Cytomel Follow-up in 2 months     [name] 09/11/2018, 8:03 AM

## 2018-11-09 ENCOUNTER — Other Ambulatory Visit (INDEPENDENT_AMBULATORY_CARE_PROVIDER_SITE_OTHER): Payer: Managed Care, Other (non HMO)

## 2018-11-09 ENCOUNTER — Ambulatory Visit: Payer: Managed Care, Other (non HMO) | Admitting: Endocrinology

## 2018-11-09 ENCOUNTER — Other Ambulatory Visit: Payer: Self-pay

## 2018-11-09 DIAGNOSIS — E063 Autoimmune thyroiditis: Secondary | ICD-10-CM

## 2018-11-09 LAB — T4, FREE: Free T4: 0.81 ng/dL (ref 0.60–1.60)

## 2018-11-09 LAB — T3, FREE: T3, Free: 3 pg/mL (ref 2.3–4.2)

## 2018-11-09 LAB — TSH: TSH: 0.72 u[IU]/mL (ref 0.35–4.50)

## 2018-11-10 ENCOUNTER — Telehealth: Payer: Self-pay | Admitting: Endocrinology

## 2018-11-10 NOTE — Progress Notes (Signed)
Patient ID: Courtney Christensen, female   DOB: 12-29-1975, 43 y.o.   MRN: [id number]            Reason for Appointment:  Hypothyroidism, f/u visit   Today's office visit was provided via telemedicine using video technique Explained to the patient and the the limitations of evaluation and management by telemedicine and the availability of in person appointments.  The patient understood the limitations and agreed to proceed. Patient also understood that the telehealth visit is billable. . Location of the patient: Home . Location of the provider: Office Only the patient and myself were participating in the encounter     History of Present Illness:   Hypothyroidism was first diagnosed in 2011  At the time of diagnosis patient was having symptoms of fatigue, decreased alertness, difficulties with memory, weight gain and hair loss She was told that her blood tests indicated hypothyroidism and was started on 50 g of levothyroxine. With starting thyroid supplementation the patient's symptoms improved and she was more alert and had improved energy level along with better weight.  She apparently felt fairly good until about middle of 2016 when she was starting to have more symptoms of low energy, sleepiness, difficulty with memory and weight gain as well as hair loss  Since her TSH was 3.27 in 8/16 her dose was increased from 50 up to 75 g; follow-up TSH was 2.27 with normal free T4 She thinks she felt only slightly better with this   She was seen in consultation in 3/17 when despite her normal TSH level she was complaining of fatigue, sleepiness, some hair loss and decreased memory and alertness and some continued weight gain  RECENT history: She had been on Cytomel 5 g daily along with 75 mcg of levothyroxine since 3/17 She was last seen 2 months ago  At that time she was having progressive weight gain despite exercising more and a little more fatigue along with new cold intolerance Because  of her TSH being higher at 5.7 her levothyroxine was increased up to 88 mcg; on the last visit free T3 had been stable  She again complains of significant fatigue and general tiredness Although she thinks she is gaining weight her weight is still about 154 at home he still has some cold intolerance compared to other people She is trying to exercise as much as possible although not able to go to the gym currently  She takes her levothyroxine and liothyronine in the morning before breakfast and no calcium or iron at the same time  Her TSH is normal at 0.70 with improved free T4   Patient's weight history is as follows:  Wt Readings from Last 3 Encounters:  09/11/18 154 lb 9.6 oz (70.1 kg)  03/13/18 142 lb 12.8 oz (64.8 kg)  09/10/17 137 lb (62.1 kg)    Thyroid function results have been as follows:  Lab Results  Component Value Date   TSH 0.72 11/09/2018   TSH 5.70 (H) 09/09/2018   TSH 2.51 03/05/2018   FREET4 0.81 11/09/2018   FREET4 0.65 09/09/2018   FREET4 0.72 03/05/2018    Lab Results  Component Value Date   T3FREE 3.0 11/09/2018   T3FREE 3.1 09/09/2018   T3FREE 3.3 03/05/2018     Past Medical History:  Diagnosis Date  . Thyroid disease     No past surgical history on file.  Family History  Problem Relation Age of Onset  . Diabetes Mother   . Thyroid disease Mother   .  Diabetes Maternal Grandmother   . Thyroid disease Maternal Grandmother     Social History:  reports that she has been smoking. She has never used smokeless tobacco. No history on file for alcohol and drug.  Allergies: No Known Allergies  Allergies as of 11/11/2018   No Known Allergies     Medication List       Accurate as of Nov 10, 2018  9:24 PM. If you have any questions, ask your nurse or doctor.        levothyroxine 88 MCG tablet Commonly known as:  SYNTHROID Take 1 tablet (88 mcg total) by mouth daily.   liothyronine 5 MCG tablet Commonly known as:  CYTOMEL TAKE 1 TABLET  BY MOUTH DAILY   sertraline 50 MG tablet Commonly known as:  ZOLOFT Take 1 tablet by mouth daily.        Review of Systems  Has been on sertraline since at least 2019 She said that she is having difficulty adapting to her work schedule   Examination:    There were no vitals taken for this visit.  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  Assessment:  HYPOTHYROIDISM, Primary   She is has been on Cytomel 5 mcg and Synthroid 88 mcg since 3/20 Even with increasing her levothyroxine and normalizing her TSH she complains of significant fatigue Likely has issues with depression or insomnia causing her continued fatigue and difficulty losing weight Unlikely that her cold intolerance which is mild and stable and related  Since she has been compliant with her regimen and has normal thyroid levels will not need to change her regimen  Discussed that she needs to follow-up with her PCP regarding fatigue, sleep issues and discussing her treatment with sertraline  Again encouraged her to exercise as regularly as possible  PLAN:   No change in above regimen Take her levothyroxine and liothyronine with water only in the morning before eating breakfast  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

## 2018-11-10 NOTE — Telephone Encounter (Signed)
Patient returning call in regards to going over medications for appointment.

## 2018-11-11 ENCOUNTER — Encounter: Payer: Self-pay | Admitting: Endocrinology

## 2018-11-11 ENCOUNTER — Encounter (HOSPITAL_BASED_OUTPATIENT_CLINIC_OR_DEPARTMENT_OTHER): Payer: Self-pay | Admitting: *Deleted

## 2018-11-11 ENCOUNTER — Ambulatory Visit (INDEPENDENT_AMBULATORY_CARE_PROVIDER_SITE_OTHER): Payer: Managed Care, Other (non HMO) | Admitting: Endocrinology

## 2018-11-11 DIAGNOSIS — L659 Nonscarring hair loss, unspecified: Secondary | ICD-10-CM

## 2018-11-11 DIAGNOSIS — E063 Autoimmune thyroiditis: Secondary | ICD-10-CM

## 2018-11-11 NOTE — Telephone Encounter (Signed)
Called pt back and completed pre rooming process.

## 2018-12-02 ENCOUNTER — Other Ambulatory Visit: Payer: Self-pay

## 2018-12-02 MED ORDER — LEVOTHYROXINE SODIUM 88 MCG PO TABS: 88.0000 ug | ORAL_TABLET | Freq: Every day | ORAL | 0 refills | Status: DC

## 2019-03-15 ENCOUNTER — Other Ambulatory Visit: Payer: Managed Care, Other (non HMO)

## 2019-03-16 ENCOUNTER — Other Ambulatory Visit: Payer: Managed Care, Other (non HMO)

## 2019-03-16 ENCOUNTER — Other Ambulatory Visit: Payer: Self-pay

## 2019-03-16 DIAGNOSIS — E063 Autoimmune thyroiditis: Secondary | ICD-10-CM

## 2019-03-16 DIAGNOSIS — L659 Nonscarring hair loss, unspecified: Secondary | ICD-10-CM

## 2019-03-16 LAB — T4, FREE: Free T4: 0.83 ng/dL (ref 0.60–1.60)

## 2019-03-16 LAB — TSH: TSH: 0.95 u[IU]/mL (ref 0.35–4.50)

## 2019-03-16 LAB — T3, FREE: T3, Free: 3.2 pg/mL (ref 2.3–4.2)

## 2019-03-18 ENCOUNTER — Other Ambulatory Visit: Payer: Self-pay

## 2019-03-18 ENCOUNTER — Ambulatory Visit (INDEPENDENT_AMBULATORY_CARE_PROVIDER_SITE_OTHER): Payer: Managed Care, Other (non HMO) | Admitting: Endocrinology

## 2019-03-18 ENCOUNTER — Ambulatory Visit: Payer: Managed Care, Other (non HMO) | Admitting: Endocrinology

## 2019-03-18 DIAGNOSIS — E063 Autoimmune thyroiditis: Secondary | ICD-10-CM

## 2019-03-18 MED ORDER — LEVOTHYROXINE SODIUM 88 MCG PO TABS: 88.0000 ug | ORAL_TABLET | Freq: Every day | ORAL | 3 refills | Status: DC

## 2019-03-18 MED ORDER — LIOTHYRONINE SODIUM 5 MCG PO TABS: 5.0000 ug | ORAL_TABLET | Freq: Every day | ORAL | 3 refills | Status: DC

## 2019-03-18 NOTE — Progress Notes (Signed)
Patient ID: Courtney Christensen, female   DOB: [birth year], 43 y.o.   MRN: [id number]            Reason for Appointment:  Hypothyroidism, f/u visit   Today's office visit was provided via telemedicine using video technique Explained to the patient and the the limitations of evaluation and management by telemedicine and the availability of in person appointments.  The patient understood the limitations and agreed to proceed. Patient also understood that the telehealth visit is billable. . Location of the patient: Home . Location of the provider: Office Only the patient and myself were participating in the encounter     History of Present Illness:   Hypothyroidism was first diagnosed in 2011  At the time of diagnosis patient was having symptoms of fatigue, decreased alertness, difficulties with memory, weight gain and hair loss She was told that her blood tests indicated hypothyroidism and was started on 50 g of levothyroxine. With starting thyroid supplementation the patient's symptoms improved and she was more alert and had improved energy level along with better weight.  She apparently felt fairly good until about middle of 2016 when she was starting to have more symptoms of low energy, sleepiness, difficulty with memory and weight gain as well as hair loss  Since her TSH was 3.27 in 8/16 her dose was increased from 50 up to 75 g; follow-up TSH was 2.27 with normal free T4 She thinks she felt only slightly better with this   She was seen in consultation in 3/17 when despite her normal TSH level she was complaining of fatigue, sleepiness, some hair loss and decreased memory and alertness and some continued weight gain  RECENT history: She had been on Cytomel 5 g daily along with 75 mcg of levothyroxine since 3/17  In 08/2018 she was having progressive weight gain despite exercising more and a little more fatigue along with new cold intolerance Because of her TSH being higher at 5.7  with normal T3 her levothyroxine was increased to 88 mcg However she did not appear to have any improvement in her fatigue with the dosage change  She was told to discuss her fatigue with her PCP as it was likely related to her insomnia and stress Her labs were normal in 5/20 and again normal this month  She says her fatigue has improved significantly, possibly from better sleep and less stress She is trying to be as active as possible although not consistently  She takes her levothyroxine and liothyronine in the morning before breakfast and no calcium or iron at the same time  Her TSH is consistently normal at 0.95 and free T4 is stable along with T3   Patient's weight history is as follows:  Wt Readings from Last 3 Encounters:  09/11/18 154 lb 9.6 oz (70.1 kg)  03/13/18 142 lb 12.8 oz (64.8 kg)  09/10/17 137 lb (62.1 kg)    Thyroid function results have been as follows:  Lab Results  Component Value Date   TSH 0.95 03/16/2019   TSH 0.72 11/09/2018   TSH 5.70 (H) 09/09/2018   FREET4 0.83 03/16/2019   FREET4 0.81 11/09/2018   FREET4 0.65 09/09/2018    Lab Results  Component Value Date   T3FREE 3.2 03/16/2019   T3FREE 3.0 11/09/2018   T3FREE 3.1 09/09/2018     Past Medical History:  Diagnosis Date  . Migraine   . Thyroid disease     No past surgical history on file.  Family History  Problem Relation Age of Onset  . Diabetes Mother   . Thyroid disease Mother   . Diabetes Maternal Grandmother   . Thyroid disease Maternal Grandmother     Social History:  reports that she has been smoking. She has never used smokeless tobacco. No history on file for alcohol and drug.  Allergies: No Known Allergies  Allergies as of 03/18/2019   No Known Allergies     Medication List       Accurate as of March 18, 2019  1:12 PM. If you have any questions, ask your nurse or doctor.        cyclobenzaprine 5 MG tablet Commonly known as: FLEXERIL Take 5 mg by mouth 3  (three) times daily as needed. For back spasms   levothyroxine 50 MCG tablet Commonly known as: SYNTHROID Take 50 mcg by mouth daily.   levothyroxine 88 MCG tablet Commonly known as: SYNTHROID Take 1 tablet (88 mcg total) by mouth daily.   liothyronine 5 MCG tablet Commonly known as: CYTOMEL TAKE 1 TABLET BY MOUTH DAILY   sertraline 50 MG tablet Commonly known as: ZOLOFT Take 1 tablet by mouth daily.        Review of Systems  Has been on sertraline since at least 2019   She is not complaining of hair loss  now Has regular menstrual cycles   Examination:    There were no vitals taken for this visit.  No facial puffiness seen on video images  Assessment:  HYPOTHYROIDISM, Primary   She is has been on Cytomel 5 mcg and Synthroid 88 mcg since 3/20  She is very consistent with her levothyroxine and Cytomel every morning  Her fatigue appears to have resolved and again unrelated to her hypothyroidism  Lab is excellent and stable with no abnormal values on her thyroid functions including TSH  PLAN:   No change in above regimen Continue to take her supplements without any interacting vitamins or supplements at the same time 90-day prescription sent She will follow-up in 6 months now  Reather LittlerAjay Dequita Schleicher 03/18/2019, 1:12 PM

## 2019-03-19 LAB — TESTOSTERONE, FREE, TOTAL, SHBG
Sex Hormone Binding: 63.6 nmol/L (ref 24.6–122.0)
Testosterone, Free: 1.4 pg/mL (ref 0.0–4.2)
Testosterone: 13 ng/dL (ref 8–48)

## 2019-07-19 ENCOUNTER — Other Ambulatory Visit: Payer: Managed Care, Other (non HMO)

## 2019-07-22 ENCOUNTER — Ambulatory Visit: Payer: Managed Care, Other (non HMO) | Admitting: Endocrinology

## 2019-09-08 ENCOUNTER — Other Ambulatory Visit: Payer: Self-pay

## 2019-09-08 ENCOUNTER — Other Ambulatory Visit (INDEPENDENT_AMBULATORY_CARE_PROVIDER_SITE_OTHER): Payer: Managed Care, Other (non HMO)

## 2019-09-08 DIAGNOSIS — E063 Autoimmune thyroiditis: Secondary | ICD-10-CM | POA: Diagnosis not present

## 2019-09-08 LAB — TSH: TSH: 1.84 u[IU]/mL (ref 0.35–4.50)

## 2019-09-08 LAB — T3, FREE: T3, Free: 3.3 pg/mL (ref 2.3–4.2)

## 2019-09-08 LAB — T4, FREE: Free T4: 1.03 ng/dL (ref 0.60–1.60)

## 2019-09-15 NOTE — Progress Notes (Signed)
Patient ID: Courtney Christensen, female   DOB: 11/23/1975, 44 y.o.   MRN: [id number]            Reason for Appointment:  Hypothyroidism, f/u visit   Today's office visit was provided via telemedicine using video technique Explained to the patient and the the limitations of evaluation and management by telemedicine and the availability of in person appointments.  The patient understood the limitations and agreed to proceed. Patient also understood that the telehealth visit is billable. . Location of the patient: Home . Location of the provider: Office Only the patient and myself were participating in the encounter     History of Present Illness:   Hypothyroidism was first diagnosed in 2011  At the time of diagnosis patient was having symptoms of fatigue, decreased alertness, difficulties with memory, weight gain and hair loss She was told that her blood tests indicated hypothyroidism and was started on 50 g of levothyroxine. With starting thyroid supplementation the patient's symptoms improved and she was more alert and had improved energy level along with better weight.  She apparently felt fairly good until about middle of 2016 when she was starting to have more symptoms of low energy, sleepiness, difficulty with memory and weight gain as well as hair loss  Since her TSH was 3.27 in 8/16 her dose was increased from 50 up to 75 g; follow-up TSH was 2.27 with normal free T4 She thinks she felt only slightly better with this   She was seen in consultation in 3/17 when despite her normal TSH level she was complaining of fatigue, sleepiness, some hair loss and decreased memory and alertness and some continued weight gain  RECENT history: She had been on Cytomel 5 g daily along with 75 mcg of levothyroxine since 3/17  In 08/2018 she was having progressive weight gain despite exercising more and a little more fatigue along with new cold intolerance Because of her TSH being higher at 5.7  with normal T3 her levothyroxine was increased to 88 mcg However she did not appear to have any improvement in her fatigue with the dosage change  She was told to discuss her fatigue with her PCP as it was likely related to her insomnia and stress Her labs were normal in 5/20 and again normal this month  She says her fatigue has improved significantly, possibly from better sleep and less stress She is trying to be as active as possible although not consistently  She takes her levothyroxine and liothyronine in the morning before breakfast and no calcium or iron at the same time  Her TSH is consistently normal at 0.95 and free T4 is stable along with T3   Patient's weight history is as follows:  Wt Readings from Last 3 Encounters:  09/11/18 154 lb 9.6 oz (70.1 kg)  03/13/18 142 lb 12.8 oz (64.8 kg)  09/10/17 137 lb (62.1 kg)    Thyroid function results have been as follows:  Lab Results  Component Value Date   TSH 1.84 09/08/2019   TSH 0.95 03/16/2019   TSH 0.72 11/09/2018   FREET4 1.03 09/08/2019   FREET4 0.83 03/16/2019   FREET4 0.81 11/09/2018    Lab Results  Component Value Date   T3FREE 3.3 09/08/2019   T3FREE 3.2 03/16/2019   T3FREE 3.0 11/09/2018     Past Medical History:  Diagnosis Date  . Migraine   . Thyroid disease     No past surgical history on file.  Family History  Problem  Relation Age of Onset  . Diabetes Mother   . Thyroid disease Mother   . Diabetes Maternal Grandmother   . Thyroid disease Maternal Grandmother     Social History:  reports that she has been smoking. She has never used smokeless tobacco. No history on file for alcohol and drug.  Allergies: No Known Allergies  Allergies as of 09/16/2019   No Known Allergies     Medication List       Accurate as of September 15, 2019  9:48 PM. If you have any questions, ask your nurse or doctor.        cyclobenzaprine 5 MG tablet Commonly known as: FLEXERIL Take 5 mg by mouth 3 (three)  times daily as needed. For back spasms   levothyroxine 88 MCG tablet Commonly known as: SYNTHROID Take 1 tablet (88 mcg total) by mouth daily.   liothyronine 5 MCG tablet Commonly known as: CYTOMEL Take 1 tablet (5 mcg total) by mouth daily before breakfast.   sertraline 50 MG tablet Commonly known as: ZOLOFT Take 1 tablet by mouth daily.        Review of Systems  Has been on sertraline since at least 2019  No recent hair loss   Examination:    There were no vitals taken for this visit.    Assessment:  HYPOTHYROIDISM, Primary   She is has been on Cytomel 5 mcg and Synthroid 88 mcg since 3/20  She is very regular with her levothyroxine and Cytomel every morning  She feels fairly good except for her weight gain which is related to decreased exercise and smoking cessation  Thyroid levels are consistently normal  PLAN:   No change in above combination of T4 and T3 supplement  She can now follow-up annually  She will discuss her weight gain with her PCP and restart exercise which she is planning to do at her gym  Reather Littler 09/15/2019, 9:48 PM

## 2019-09-16 ENCOUNTER — Other Ambulatory Visit: Payer: Self-pay

## 2019-09-16 ENCOUNTER — Ambulatory Visit (INDEPENDENT_AMBULATORY_CARE_PROVIDER_SITE_OTHER): Payer: Managed Care, Other (non HMO) | Admitting: Endocrinology

## 2019-09-16 DIAGNOSIS — E063 Autoimmune thyroiditis: Secondary | ICD-10-CM | POA: Diagnosis not present

## 2019-09-28 ENCOUNTER — Emergency Department (HOSPITAL_COMMUNITY): Payer: Managed Care, Other (non HMO)

## 2019-09-28 ENCOUNTER — Emergency Department (HOSPITAL_COMMUNITY)
Admission: EM | Admit: 2019-09-28 | Discharge: 2019-09-29 | Disposition: A | Discharge status: Home / Self Care | Payer: Managed Care, Other (non HMO) | Source: Home / Self Care | Attending: Emergency Medicine | Admitting: Emergency Medicine

## 2019-09-28 ENCOUNTER — Emergency Department (HOSPITAL_COMMUNITY)
Admission: EM | Admit: 2019-09-28 | Discharge: 2019-09-28 | Disposition: A | Discharge status: Home / Self Care | Payer: Managed Care, Other (non HMO) | Attending: Emergency Medicine | Admitting: Emergency Medicine

## 2019-09-28 ENCOUNTER — Encounter (HOSPITAL_COMMUNITY): Payer: Self-pay | Admitting: Emergency Medicine

## 2019-09-28 ENCOUNTER — Other Ambulatory Visit: Payer: Self-pay

## 2019-09-28 ENCOUNTER — Encounter (HOSPITAL_COMMUNITY): Payer: Self-pay | Admitting: *Deleted

## 2019-09-28 DIAGNOSIS — F17200 Nicotine dependence, unspecified, uncomplicated: Secondary | ICD-10-CM | POA: Diagnosis not present

## 2019-09-28 DIAGNOSIS — M545 Low back pain, unspecified: Secondary | ICD-10-CM

## 2019-09-28 DIAGNOSIS — Z79899 Other long term (current) drug therapy: Secondary | ICD-10-CM | POA: Diagnosis not present

## 2019-09-28 DIAGNOSIS — R109 Unspecified abdominal pain: Secondary | ICD-10-CM | POA: Insufficient documentation

## 2019-09-28 LAB — CBC WITH DIFFERENTIAL/PLATELET
Abs Immature Granulocytes: 0.02 10*3/uL (ref 0.00–0.07)
Basophils Absolute: 0 10*3/uL (ref 0.0–0.1)
Basophils Relative: 0 %
Eosinophils Absolute: 0 10*3/uL (ref 0.0–0.5)
Eosinophils Relative: 0 %
HCT: 39.2 % (ref 36.0–46.0)
Hemoglobin: 13.2 g/dL (ref 12.0–15.0)
Immature Granulocytes: 0 %
Lymphocytes Relative: 13 %
Lymphs Abs: 1.3 10*3/uL (ref 0.7–4.0)
MCH: 30.4 pg (ref 26.0–34.0)
MCHC: 33.7 g/dL (ref 30.0–36.0)
MCV: 90.3 fL (ref 80.0–100.0)
Monocytes Absolute: 0.4 10*3/uL (ref 0.1–1.0)
Monocytes Relative: 4 %
Neutro Abs: 8.8 10*3/uL — ABNORMAL HIGH (ref 1.7–7.7)
Neutrophils Relative %: 83 %
Platelets: 220 10*3/uL (ref 150–400)
RBC: 4.34 MIL/uL (ref 3.87–5.11)
RDW: 12.6 % (ref 11.5–15.5)
WBC: 10.6 10*3/uL — ABNORMAL HIGH (ref 4.0–10.5)
nRBC: 0 % (ref 0.0–0.2)

## 2019-09-28 LAB — BASIC METABOLIC PANEL
Anion gap: 6 (ref 5–15)
BUN: 15 mg/dL (ref 6–20)
CO2: 23 mmol/L (ref 22–32)
Calcium: 9 mg/dL (ref 8.9–10.3)
Chloride: 107 mmol/L (ref 98–111)
Creatinine, Ser: 0.83 mg/dL (ref 0.44–1.00)
GFR calc Af Amer: >60 mL/min (ref >60)
GFR calc non Af Amer: >60 mL/min (ref >60)
Glucose, Bld: 134 mg/dL — ABNORMAL HIGH (ref 70–99)
Potassium: 3.8 mmol/L (ref 3.5–5.1)
Sodium: 136 mmol/L (ref 135–145)

## 2019-09-28 LAB — I-STAT BETA HCG BLOOD, ED (MC, WL, AP ONLY): I-stat hCG, quantitative: <5 m[IU]/mL (ref <5)

## 2019-09-28 MED ORDER — HYDROMORPHONE HCL 1 MG/ML IJ SOLN
0.5000 mg | Freq: Once | INTRAMUSCULAR | Status: AC
  Administered 2019-09-28: 0.5 mg via INTRAVENOUS
  Filled 2019-09-28: qty 1

## 2019-09-28 MED ORDER — METHOCARBAMOL 500 MG PO TABS: 500.0000 mg | ORAL_TABLET | Freq: Three times a day (TID) | ORAL | 0 refills | Status: AC

## 2019-09-28 MED ORDER — SODIUM CHLORIDE 0.9 % IV BOLUS
1000.0000 mL | Freq: Once | INTRAVENOUS | Status: AC
  Administered 2019-09-28: 1000 mL via INTRAVENOUS

## 2019-09-28 MED ORDER — HYDROMORPHONE HCL 1 MG/ML IJ SOLN
1.0000 mg | Freq: Once | INTRAMUSCULAR | Status: AC
  Administered 2019-09-28: 1 mg via INTRAVENOUS
  Filled 2019-09-28: qty 1

## 2019-09-28 MED ORDER — KETOROLAC TROMETHAMINE 30 MG/ML IJ SOLN
15.0000 mg | Freq: Once | INTRAMUSCULAR | Status: AC
  Administered 2019-09-28: 15 mg via INTRAVENOUS
  Filled 2019-09-28: qty 1

## 2019-09-28 MED ORDER — HYDROCODONE-ACETAMINOPHEN 5-325 MG PO TABS
2.0000 | ORAL_TABLET | Freq: Once | ORAL | Status: AC
  Administered 2019-09-28: 2 via ORAL
  Filled 2019-09-28: qty 2

## 2019-09-28 MED ORDER — KETOROLAC TROMETHAMINE 60 MG/2ML IM SOLN
15.0000 mg | Freq: Once | INTRAMUSCULAR | Status: AC
  Administered 2019-09-28: 15 mg via INTRAMUSCULAR
  Filled 2019-09-28: qty 2

## 2019-09-28 MED ORDER — LIDOCAINE 5 % EX PTCH: 1.0000 | MEDICATED_PATCH | CUTANEOUS | 0 refills | Status: DC

## 2019-09-28 MED ORDER — LIDOCAINE 5 % EX PTCH
1.0000 | MEDICATED_PATCH | CUTANEOUS | Status: DC
  Administered 2019-09-28: 1 via TRANSDERMAL
  Filled 2019-09-28: qty 1

## 2019-09-28 MED ORDER — LORAZEPAM 2 MG/ML IJ SOLN
0.5000 mg | Freq: Once | INTRAMUSCULAR | Status: AC
  Administered 2019-09-28: 0.5 mg via INTRAVENOUS
  Filled 2019-09-28: qty 1

## 2019-09-28 MED ORDER — PROMETHAZINE HCL 25 MG/ML IJ SOLN
12.5000 mg | Freq: Once | INTRAMUSCULAR | Status: AC
  Administered 2019-09-28: 12.5 mg via INTRAVENOUS
  Filled 2019-09-28: qty 1

## 2019-09-28 NOTE — ED Provider Notes (Signed)
[facility] EMERGENCY DEPARTMENT Provider Note   CSN: [id number] Arrival date & time: 09/28/19  1540     History Chief Complaint  Patient presents with  . Back Injury    Courtney Christensen is a 44 y.o. female history of hypothyroidism otherwise healthy.  Patient presents today for right-sided back pain onset shortly after 4 PM yesterday.  Patient reports that she was carrying a large bag of cat litter, approximately 40 pounds, up the stairs at her home.  She felt well immediately after completing carrying the litter however around 20 minutes later she describes gradual onset right low back pain, squeezing in nature, moderate-severe in intensity only present with certain movements improves if staying still, pain nonradiating.  Patient attempted to perform yoga and toe touches last night without relief of pain.  Denies fever/chills, abdominal pain, nausea/vomiting, dysuria/hematuria, numbness/weakness, tingling, saddle area paresthesias, bowel/bladder incontinence, urinary retention or any additional concerns.  HPI     Past Medical History:  Diagnosis Date  . Migraine   . Thyroid disease     Patient Active Problem List   Diagnosis Date Noted  . Intractable migraine without status migrainosus 03/04/2017  . Moderate episode of recurrent major depressive disorder (HCC) 03/04/2017  . 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 Oligomenorrhea 09/14/2015    History reviewed. No pertinent surgical history.   OB History   No obstetric history on file.     Family History  Problem Relation Age of Onset  . Diabetes Mother   . Thyroid disease Mother   . Diabetes Maternal Grandmother   . Thyroid disease Maternal Grandmother     Social History   Tobacco Use  . Smoking status: Current Every Day Smoker  . Smokeless tobacco: Never Used  Substance Use Topics  . Alcohol use: Not on file  . Drug use: Not on file    Home Medications Prior to Admission medications    Medication Sig Start Date End Date Taking? Authorizing Provider  levothyroxine (SYNTHROID) 88 MCG tablet Take 1 tablet (88 mcg total) by mouth daily. 03/18/19  Yes Reather Littler, MD  liothyronine (CYTOMEL) 5 MCG tablet Take 1 tablet (5 mcg total) by mouth daily before breakfast. 03/18/19  Yes Reather Littler, MD  sertraline (ZOLOFT) 50 MG tablet Take 1 tablet by mouth daily. 07/29/17  Yes [provider]  lidocaine (LIDODERM) 5 % Place 1 patch onto the skin daily. Remove & Discard patch within 12 hours or as directed by MD 09/28/19   Bill Salinas, PA-C  methocarbamol (ROBAXIN) 500 MG tablet Take 1 tablet (500 mg total) by mouth 3 (three) times daily. 09/28/19   Bill Salinas, PA-C    Allergies    Patient has no known allergies.  Review of Systems   Review of Systems Ten systems are reviewed and are negative for acute change except as noted in the HPI  Physical Exam Updated Vital Signs BP 111/68 (BP Location: Left Arm)   Pulse 77   Temp 98.3 F (36.8 C) (Oral)   Resp 18   Ht 5\' 2"  (1.575 m)   Wt 76.7 kg   LMP 09/21/2019 (Exact Date)   SpO2 96%   BMI 30.91 kg/m   Physical Exam Constitutional:      General: She is not in acute distress.    Appearance: Normal appearance. She is well-developed. She is not ill-appearing or diaphoretic.  HENT:     Head: Normocephalic and atraumatic.     Right Ear: External ear normal.  Left Ear: External ear normal.     Nose: Nose normal.  Eyes:     General: Vision grossly intact. Gaze aligned appropriately.     Pupils: Pupils are equal, round, and reactive to light.  Neck:     Trachea: Trachea and phonation normal. No tracheal deviation.  Pulmonary:     Effort: Pulmonary effort is normal. No respiratory distress.  Abdominal:     General: There is no distension.     Palpations: Abdomen is soft.     Tenderness: There is no abdominal tenderness. There is no guarding or rebound.  Musculoskeletal:        General: Normal range of  motion.     Cervical back: Normal range of motion.     Comments: No midline C/T/L spinal tenderness to palpation, no deformity, crepitus, or step-off noted. No sign of injury to the neck or back. - Tenderness to palpation of the right paraspinal musculature without overlying skin changes.  Skin:    General: Skin is warm and dry.  Neurological:     Mental Status: She is alert.     GCS: GCS eye subscore is 4. GCS verbal subscore is 5. GCS motor subscore is 6.     Comments: Speech is clear and goal oriented, follows commands Major Cranial nerves without deficit, no facial droop Moves extremities without ataxia, coordination intact Strength with dorsi and plantar flexion intact and equal No clonus of the feet, DTR 2+ bilateral patella  Psychiatric:        Behavior: Behavior normal.    ED Results / Procedures / Treatments   Labs (all labs ordered are listed, but only abnormal results are displayed) Labs Reviewed - No data to display  EKG None  Radiology No results found.  Procedures Procedures (including critical care time)  Medications Ordered in ED Medications  lidocaine (LIDODERM) 5 % 1 patch (1 patch Transdermal Patch Applied 09/28/19 0907)  ketorolac (TORADOL) injection 15 mg (15 mg Intramuscular Given 09/28/19 0907)  HYDROcodone-acetaminophen (NORCO/VICODIN) 5-325 MG per tablet 2 tablet (2 tablets Oral Given 09/28/19 9622)    ED Course  I have reviewed the triage vital signs and the nursing notes.  Pertinent labs & imaging results that were available during my care of the patient were reviewed by me and considered in my medical decision making (see chart for details).    MDM Rules/Calculators/A&P                     Of note patient denies chance of pregnancy today.  Patient states understanding that medications given or prescribed today may result in harm to of a pregnancy and she accepts these risks and still chooses not to be pregnancy tested and proceed with  medications.  Additionally patient denies history of CKD or gastric ulcers. - Courtney Christensen is a 44 y.o. female presenting with right lower back pain.  That began approximately 20 minutes after carrying a large box of letter up the stairs of her home yesterday afternoon. Patient denies history of trauma, fever, IV drug use, night sweats, weight loss, cancer, saddle anesthesia, urinary rentention, bowel/bladder incontinence. No neurological deficits and normal neuro exam. Suspect musculoskeletal etiology of patient's pain. Pain is consistently reproducible with palpation of the back musculature. Abdomen soft/nontender and without pulsatile mass. Patient with equal pedal pulses. Doubt spinal epidural abscess, cauda equina, kidney stone/UTI or other emergent pathologies.  Shared decision making was made, patient does not want any x-ray of her lumbar spine  today, she prefers to proceed with symptomatic therapies.  Patient was treated with 15 mg intramuscular Toradol, a Lidoderm patch and 2 pills Norco.  Patient husband is here to drive her home today, she states understanding of narcotic precautions. - Patient reassessed, sleeping in bed, no acute distress.  She reports that she is feeling much better and pain nearly completely resolved.  She is requesting discharge. Robaxin 500mg  BID prescribed. Patient informed to avoid driving or performing dangerous activities while taking muscle relaxer.  At this time there does not appear to be any evidence of an acute emergency medical condition and the patient appears stable for discharge with appropriate outpatient follow up. Diagnosis was discussed with patient who verbalizes understanding of care plan and is agreeable to discharge. I have discussed return precautions with patient who verbalizes understanding of return precautions. Patient encouraged to follow-up with their PCP. All questions answered.  Note: Portions of this report may have been transcribed using  voice recognition software. Every effort was made to ensure accuracy; however, inadvertent computerized transcription errors may still be present. Final Clinical Impression(s) / ED Diagnoses Final diagnoses:  Acute right-sided low back pain without sciatica    Rx / DC Orders ED Discharge Orders         Ordered    methocarbamol (ROBAXIN) 500 MG tablet  3 times daily     09/28/19 1048    lidocaine (LIDODERM) 5 %  Every 24 hours     09/28/19 1048           Gari Crown 09/28/19 1052    Daleen Bo, MD 09/30/19 [phone]

## 2019-09-28 NOTE — ED Triage Notes (Signed)
Pt was carrying 40 lbs of cat litter up the stairs and 20 min after pain started.  Rating pain 9-10/10.

## 2019-09-28 NOTE — ED Notes (Signed)
Pt says she has not pain, back feels tight.

## 2019-09-28 NOTE — ED Triage Notes (Signed)
Pt seen for same lower back pain, went home and came back via [facility] for worse back pain and N/V.  Unable to keep anything down.

## 2019-09-28 NOTE — Discharge Instructions (Addendum)
You have been diagnosed today with Right Lower Back Pain.  At this time there does not appear to be the presence of an emergent medical condition, however there is always the potential for conditions to change. Please read and follow the below instructions.  Please return to the Emergency Department immediately for any new or worsening symptoms. Please be sure to follow up with your Primary Care Provider within one week regarding your visit today; please call their office to schedule an appointment even if you are feeling better for a follow-up visit. You may use the muscle relaxer Robaxin as prescribed to help with your symptoms.  Do not drive or operate heavy machinery while taking Robaxin as it will make you drowsy.  Do not drink alcohol or take other sedating medications while taking Robaxin as this will worsen side effects. You have been given an NSAID-containing medication called Toradol today.  Do not take the medications including ibuprofen, Aleve, Advil, naproxen or other NSAID-containing medications for the next 2 days.  Please be sure to drink plenty of water over the next few days. You may use the Lidoderm patches as prescribed to help with your back pain.  Please drink plenty of water and get plenty of rest.  Get help right away if: You develop new bowel or bladder control problems. You have unusual weakness or numbness in your arms or legs. You develop nausea or vomiting. You develop abdominal pain. You feel faint. You have any new/concerning or worsening of symptoms  Please read the additional information packets attached to your discharge summary.  Do not take your medicine if  develop an itchy rash, swelling in your mouth or lips, or difficulty breathing; call 911 and seek immediate emergency medical attention if this occurs.  Note: Portions of this text may have been transcribed using voice recognition software. Every effort was made to ensure accuracy; however, inadvertent  computerized transcription errors may still be present.

## 2019-09-29 ENCOUNTER — Telehealth (HOSPITAL_COMMUNITY): Payer: Self-pay | Admitting: Emergency Medicine

## 2019-09-29 MED ORDER — DEXAMETHASONE 4 MG PO TABS: 4.0000 mg | ORAL_TABLET | Freq: Two times a day (BID) | ORAL | 0 refills | Status: DC

## 2019-09-29 MED ORDER — HYDROCODONE-ACETAMINOPHEN 5-325 MG PO TABS: 1.0000 | ORAL_TABLET | ORAL | 0 refills | Status: DC | PRN

## 2019-09-29 NOTE — Telephone Encounter (Signed)
Needed pain med script sent to different pharmacy

## 2019-10-04 NOTE — ED Provider Notes (Signed)
[facility] EMERGENCY DEPARTMENT Provider Note   CSN: [id number] Arrival date & time: 09/28/19  1854     History Chief Complaint  Patient presents with  . Back Pain    Courtney Christensen is a 44 y.o. female.  HPI   44 year old female with lower back pain.  She is seen in the emergency room earlier for the same complaint.  Went home when pain worsened and also associated now with nausea and vomiting.  Denies any acute trauma or strain.  No fevers or chills.  No acute urinary symptoms.  Pain radiates into her buttock.  No acute weakness or numbness.  Past Medical History:  Diagnosis Date  . Migraine   . Thyroid disease     Patient Active Problem List   Diagnosis Date Noted  . Intractable migraine without status migrainosus 03/04/2017  . Moderate episode of recurrent major depressive disorder (HCC) 03/04/2017  . 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 Oligomenorrhea 09/14/2015    History reviewed. No pertinent surgical history.   OB History   No obstetric history on file.     Family History  Problem Relation Age of Onset  . Diabetes Mother   . Thyroid disease Mother   . Diabetes Maternal Grandmother   . Thyroid disease Maternal Grandmother     Social History   Tobacco Use  . Smoking status: Current Every Day Smoker  . Smokeless tobacco: Never Used  Substance Use Topics  . Alcohol use: Not on file  . Drug use: Not on file    Home Medications Prior to Admission medications   Medication Sig Start Date End Date Taking? Authorizing Provider  levothyroxine (SYNTHROID) 88 MCG tablet Take 1 tablet (88 mcg total) by mouth daily. 03/18/19  Yes Reather Littler, MD  liothyronine (CYTOMEL) 5 MCG tablet Take 1 tablet (5 mcg total) by mouth daily before breakfast. 03/18/19  Yes Reather Littler, MD  sertraline (ZOLOFT) 50 MG tablet Take 1 tablet by mouth daily. 07/29/17  Yes [provider]  dexamethasone (DECADRON) 4 MG tablet Take 1 tablet (4 mg total) by mouth 2  (two) times daily. 09/29/19   Raeford Razor, MD  HYDROcodone-acetaminophen (NORCO/VICODIN) 5-325 MG tablet Take 1 tablet by mouth every 4 (four) hours as needed. 09/29/19   Terrilee Files, MD  lidocaine (LIDODERM) 5 % Place 1 patch onto the skin daily. Remove & Discard patch within 12 hours or as directed by MD 09/28/19   Bill Salinas, PA-C  methocarbamol (ROBAXIN) 500 MG tablet Take 1 tablet (500 mg total) by mouth 3 (three) times daily. 09/28/19   Bill Salinas, PA-C    Allergies    Patient has no known allergies.  Review of Systems   Review of Systems All systems reviewed and negative, other than as noted in HPI.  Physical Exam Updated Vital Signs BP 109/67   Pulse 64   Temp 97.7 F (36.5 C) (Oral)   Resp 15   Ht 5\' 2"  (1.575 m)   Wt 76.7 kg   LMP 09/21/2019 (Exact Date)   SpO2 96%   BMI 30.91 kg/m   Physical Exam Vitals and nursing note reviewed.  Constitutional:      General: She is not in acute distress.    Appearance: She is well-developed.     Comments: Sitting in bed.  Appears uncomfortable.  Holding her right lower back.  HENT:     Head: Normocephalic and atraumatic.  Eyes:     General:  Right eye: No discharge.        Left eye: No discharge.     Conjunctiva/sclera: Conjunctivae normal.  Cardiovascular:     Rate and Rhythm: Normal rate and regular rhythm.     Heart sounds: Normal heart sounds. No murmur. No friction rub. No gallop.   Pulmonary:     Effort: Pulmonary effort is normal. No respiratory distress.     Breath sounds: Normal breath sounds.  Abdominal:     General: There is no distension.     Palpations: Abdomen is soft.     Tenderness: There is no abdominal tenderness.  Musculoskeletal:     Cervical back: Neck supple.     Comments: No midline spinal tenderness.  Tenderness to palpation right lower back into the right flank.  No overlying skin changes.  Can actively range her right lower extremity without apparent difficulty.   Neurovascular intact.  Skin:    General: Skin is warm and dry.  Neurological:     Mental Status: She is alert.  Psychiatric:        Behavior: Behavior normal.        Thought Content: Thought content normal.     ED Results / Procedures / Treatments   Labs (all labs ordered are listed, but only abnormal results are displayed) Labs Reviewed  CBC WITH DIFFERENTIAL/PLATELET - Abnormal; Notable for the following components:      Result Value   WBC 10.6 (*)    Neutro Abs 8.8 (*)    All other components within normal limits  BASIC METABOLIC PANEL - Abnormal; Notable for the following components:   Glucose, Bld 134 (*)    All other components within normal limits  I-STAT BETA HCG BLOOD, ED ([facility], [facility], [facility] ONLY)    EKG None  Radiology No results found.  Procedures Procedures (including critical care time)  Medications Ordered in ED Medications  HYDROmorphone (DILAUDID) injection 0.5 mg (0.5 mg Intravenous Given 09/28/19 1943)  ketorolac (TORADOL) 30 MG/ML injection 15 mg (15 mg Intravenous Given 09/28/19 1943)  LORazepam (ATIVAN) injection 0.5 mg (0.5 mg Intravenous Given 09/28/19 1943)  sodium chloride 0.9 % bolus 1,000 mL (0 mLs Intravenous Stopped 09/28/19 2315)  promethazine (PHENERGAN) injection 12.5 mg (12.5 mg Intravenous Given 09/28/19 2135)  HYDROmorphone (DILAUDID) injection 1 mg (1 mg Intravenous Given 09/28/19 2135)    ED Course  I have reviewed the triage vital signs and the nursing notes.  Pertinent labs & imaging results that were available during my care of the patient were reviewed by me and considered in my medical decision making (see chart for details).    MDM Rules/Calculators/A&P                      44 year old female with right lower back pain.  Possible herniated disc.  Neuro exam nonfocal.  Does not appear to be ureteral stone.  CT looked fine.  She was treated symptomatically with improvement of symptoms.  Plan continue symptomatic treatment.  Follow-up if  symptoms persist.  May potentially end up needing an MRI. Final Clinical Impression(s) / ED Diagnoses Final diagnoses:  Acute right-sided low back pain, unspecified whether sciatica present    Rx / DC Orders ED Discharge Orders         Ordered    dexamethasone (DECADRON) 4 MG tablet  2 times daily     09/29/19 0020    HYDROcodone-acetaminophen (NORCO/VICODIN) 5-325 MG tablet  Every 4 hours PRN,   Status:  Discontinued     09/29/19 0020           Virgel Manifold, MD 10/04/19 [phone]

## 2020-06-04 ENCOUNTER — Other Ambulatory Visit: Payer: Self-pay | Admitting: Endocrinology

## 2020-09-04 ENCOUNTER — Other Ambulatory Visit: Payer: Self-pay | Admitting: Endocrinology

## 2020-11-07 IMAGING — CT CT RENAL STONE PROTOCOL
2 of 4 series · 17 of 46 positions shown, 19 images · non-contrast
Comparison: None.

CLINICAL DATA: Low back pain

EXAM:
CT ABDOMEN AND PELVIS WITHOUT CONTRAST
TECHNIQUE: Multidetector CT imaging of the abdomen and pelvis was performed
following the standard protocol without IV contrast.

[Series 2: axial st · axial · 0.82mm/px · z∈[+679,+1079]mm · 14 of 94 slices shown, 16 images]
[im 7/94  soft-tissue]
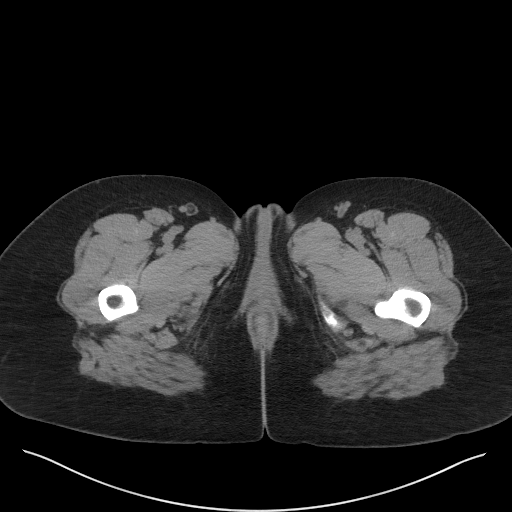
[im 7/94  bone]
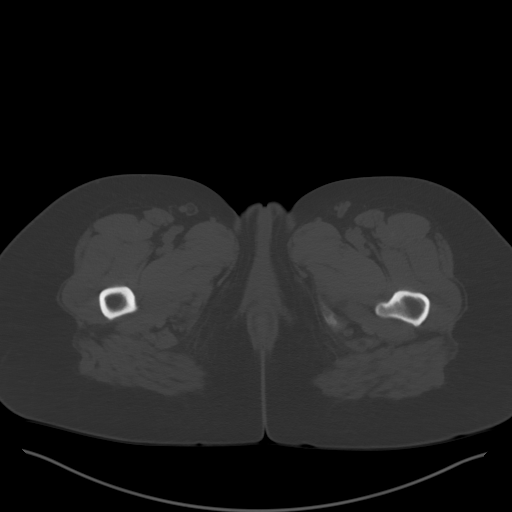
[im 13/94  soft-tissue]
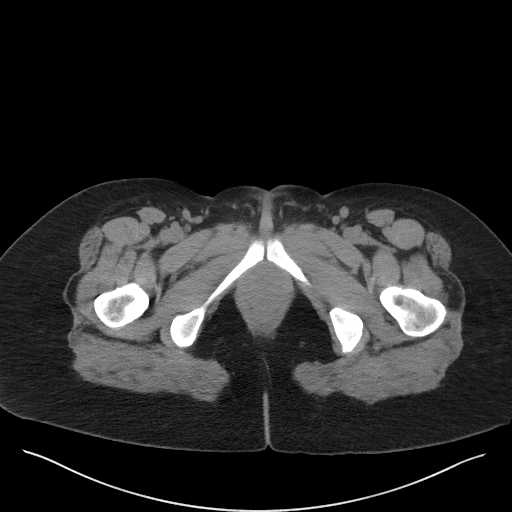
[im 19/94  soft-tissue]
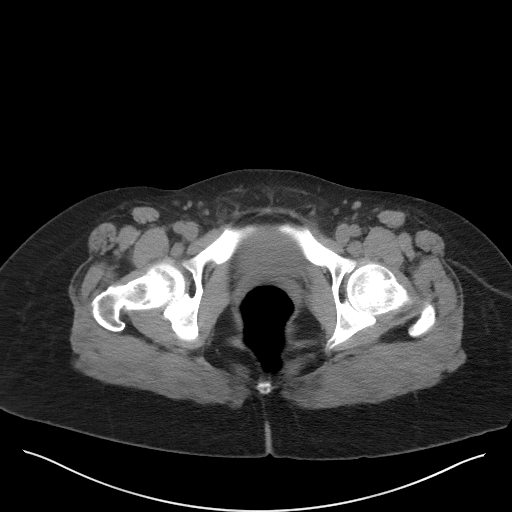
[im 25/94  soft-tissue]
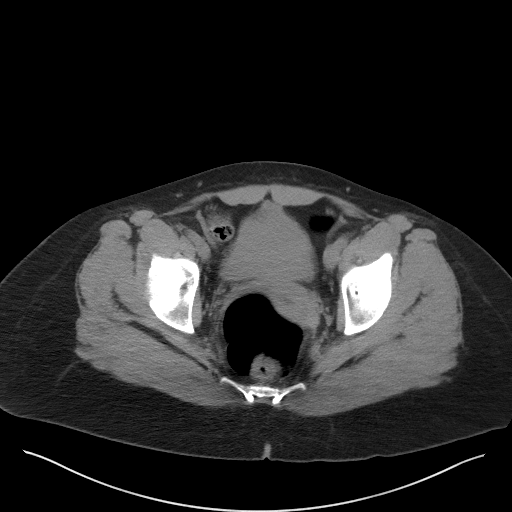
[im 32/94  soft-tissue]
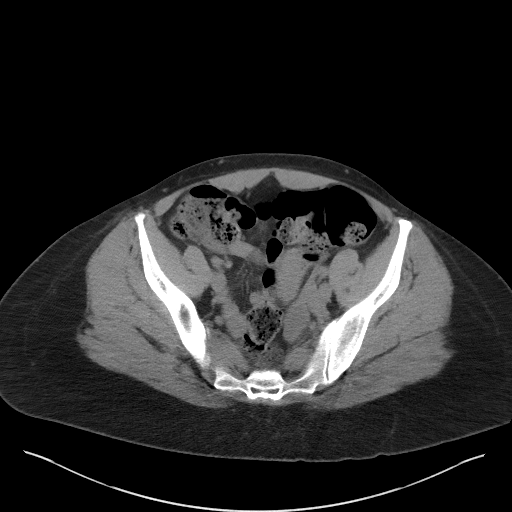
[im 38/94  soft-tissue]
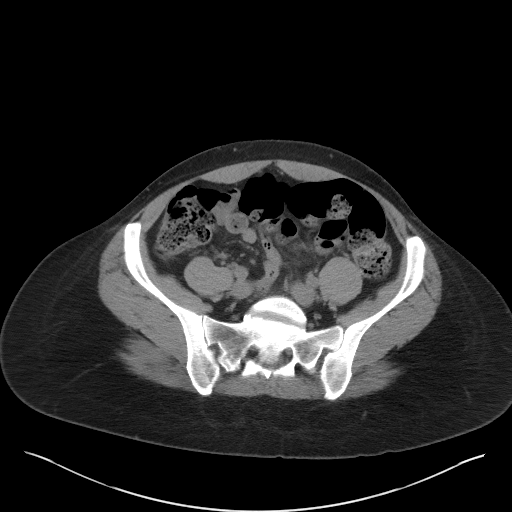
[im 44/94  soft-tissue]
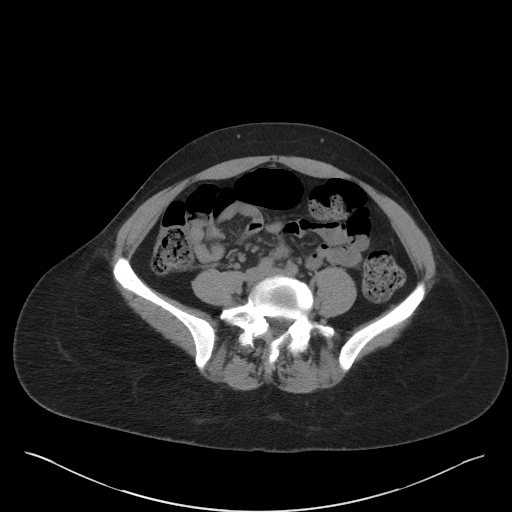
[im 50/94  soft-tissue]
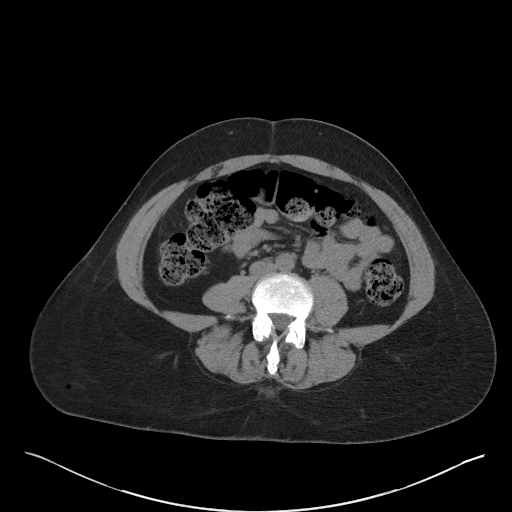
[im 56/94  soft-tissue]
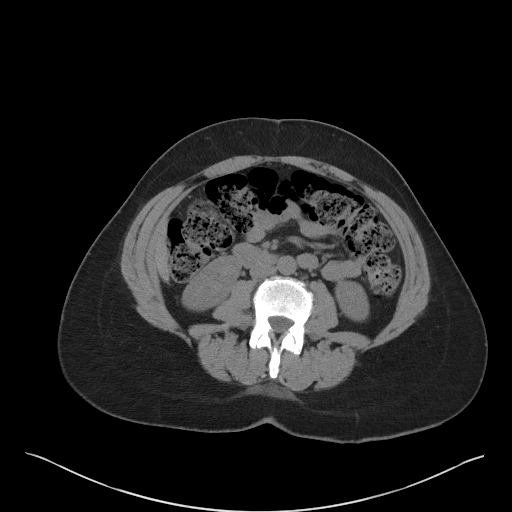
[im 56/94  bone]
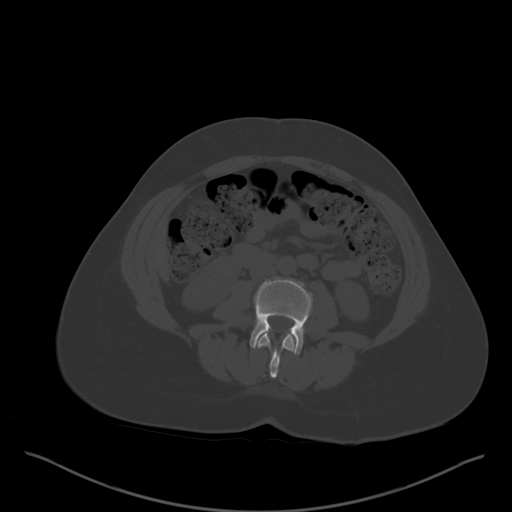
[im 63/94  soft-tissue]
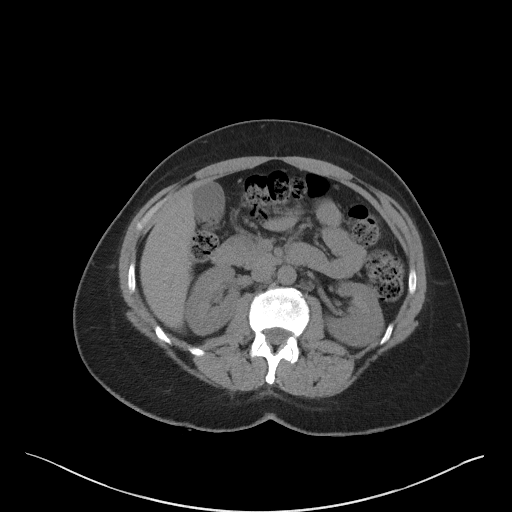
[im 69/94  soft-tissue]
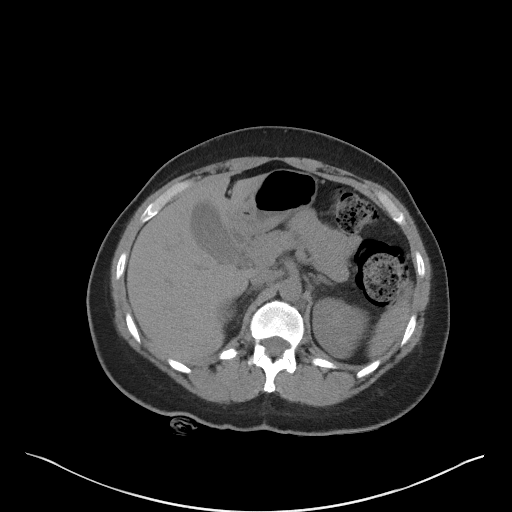
[im 75/94  soft-tissue]
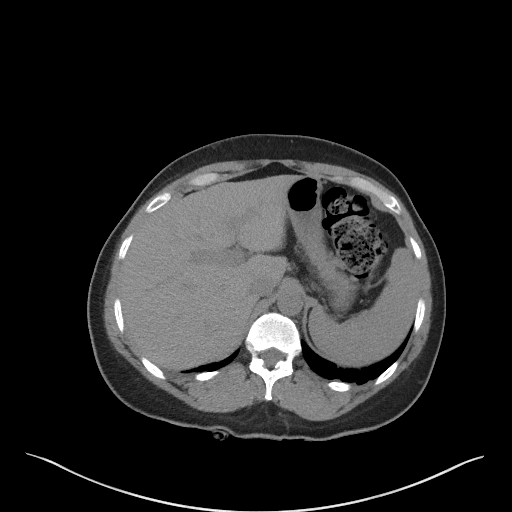
[im 81/94  soft-tissue]
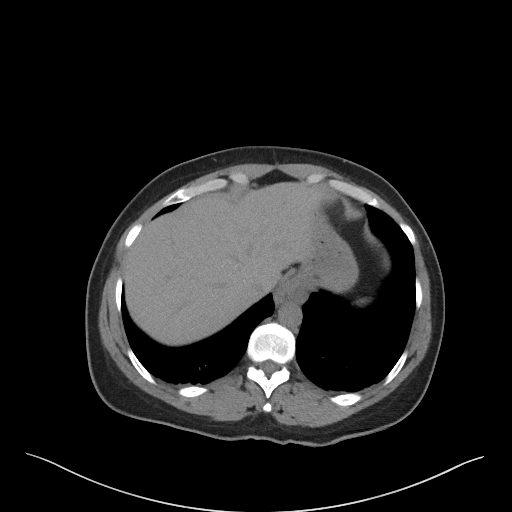
[im 87/94  soft-tissue]
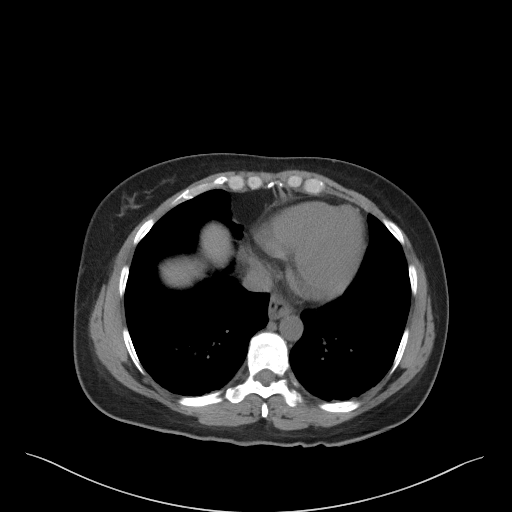

[Series 5: coronal st · coronal · 0.93mm/px · 3 of 96 slices shown]
[im 32/96  soft-tissue]
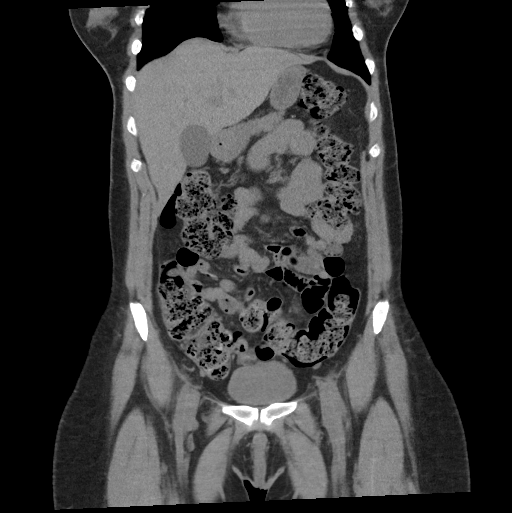
[im 43/96  soft-tissue]
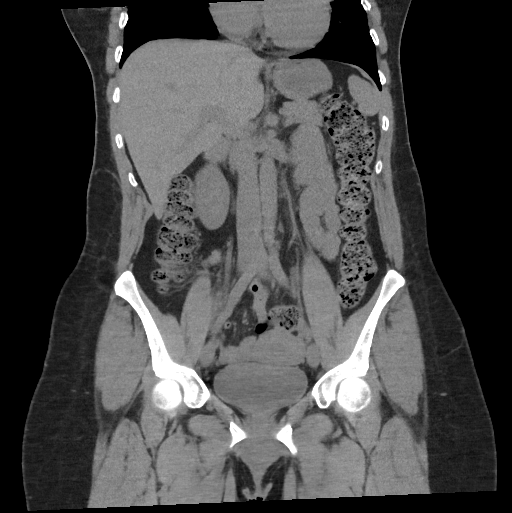
[im 53/96  soft-tissue]
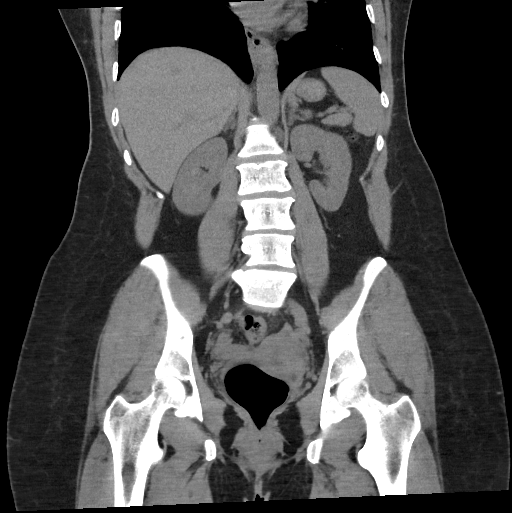

[17 of 46 positions shown; findings below may reference images not displayed]

FINDINGS: Lower chest: Lung bases demonstrate patchy dependent atelectasis. No
consolidation or effusion. Small hiatal hernia. Normal heart size

Hepatobiliary: No focal liver abnormality is seen. No gallstones,
gallbladder wall thickening, or biliary dilatation.

Pancreas: Unremarkable. No pancreatic ductal dilatation or
surrounding inflammatory changes.

Spleen: Normal in size without focal abnormality.

Adrenals/Urinary Tract: Adrenal glands are unremarkable. Kidneys are
normal, without renal calculi, focal lesion, or hydronephrosis.
Bladder is unremarkable.

Stomach/Bowel: Stomach is within normal limits. Appendix appears
normal. No evidence of bowel wall thickening, distention, or
inflammatory changes. Large amount of stool in the colon.

Vascular/Lymphatic: No significant vascular findings are present. No
enlarged abdominal or pelvic lymph nodes.

Reproductive: Uterus unremarkable. 2.9 cm left adnexal cyst, felt
benign.

Other: Negative for free air or free fluid

Musculoskeletal: No acute or significant osseous findings.
IMPRESSION: 1. Negative for hydronephrosis or ureteral stone.
2. No CT evidence for acute intra-abdominal or pelvic abnormality

## 2020-12-01 ENCOUNTER — Other Ambulatory Visit: Payer: Self-pay | Admitting: Endocrinology

## 2020-12-10 ENCOUNTER — Other Ambulatory Visit: Payer: Self-pay | Admitting: Endocrinology

## 2020-12-10 DIAGNOSIS — E063 Autoimmune thyroiditis: Secondary | ICD-10-CM

## 2020-12-11 ENCOUNTER — Other Ambulatory Visit (INDEPENDENT_AMBULATORY_CARE_PROVIDER_SITE_OTHER): Payer: Managed Care, Other (non HMO)

## 2020-12-11 ENCOUNTER — Other Ambulatory Visit: Payer: Self-pay

## 2020-12-11 DIAGNOSIS — E063 Autoimmune thyroiditis: Secondary | ICD-10-CM | POA: Diagnosis not present

## 2020-12-11 LAB — TSH: TSH: 13.03 u[IU]/mL — ABNORMAL HIGH (ref 0.35–4.50)

## 2020-12-11 LAB — T3, FREE: T3, Free: 2.6 pg/mL (ref 2.3–4.2)

## 2020-12-11 LAB — T4, FREE: Free T4: 0.63 ng/dL (ref 0.60–1.60)

## 2020-12-12 NOTE — Progress Notes (Signed)
Patient ID: Courtney Christensen, female   DOB: 1976/05/19, 45 y.o.   MRN: [id number]            Reason for Appointment:  Hypothyroidism, f/u visit      History of Present Illness:   Hypothyroidism was first diagnosed in 2011  At the time of diagnosis patient was having symptoms of fatigue, decreased alertness, difficulties with memory, weight gain and hair loss She was told that her blood tests indicated hypothyroidism and was started on 50 g of levothyroxine. With starting thyroid supplementation the patient's symptoms improved and she was more alert and had improved energy level along with better weight.  She apparently felt fairly good until about middle of 2016 when she was starting to have more symptoms of low energy, sleepiness, difficulty with memory and weight gain as well as hair loss  Since her TSH was 3.27 in 8/16 her dose was increased from 50 up to 75 g; follow-up TSH was 2.27 with normal free T4 She thinks she felt only slightly better with this   She was seen in consultation in 3/17 when despite her normal TSH level she was complaining of fatigue, sleepiness, some hair loss and decreased memory and alertness and some continued weight gain  RECENT history: She had been on Cytomel 5 g daily along with levothyroxine since 3/17  In 08/2018 she was having progressive weight gain despite exercising more and a little more fatigue along with new cold intolerance Because of her TSH being higher at 5.7 with normal T3 her levothyroxine was increased to 88 mcg However she did not appear to have any improvement in her fatigue with the dosage change  He has not been seen since 08/2019  She does not think she has any unusual fatigue except when she did not get enough rest of sleep She is concerned about her weight gain despite exercise She is having some hair thinning May get cold easily  She takes her levothyroxine and liothyronine in the morning before breakfast She thinks she has  not missed any doses Has not taken any supplements with calcium or iron  Her TSH which was consistently normal is unusually high at 13  Free T4 was low normal compared to previous results   Patient's weight history is as follows:  Wt Readings from Last 3 Encounters:  12/13/20 178 lb 12.8 oz (81.1 kg)  09/28/19 169 lb (76.7 kg)  09/28/19 169 lb (76.7 kg)    Thyroid function results have been as follows:  Lab Results  Component Value Date   TSH 13.03 (H) 12/11/2020   TSH 1.84 09/08/2019   TSH 0.95 03/16/2019   FREET4 0.63 12/11/2020   FREET4 1.03 09/08/2019   FREET4 0.83 03/16/2019    Lab Results  Component Value Date   T3FREE 2.6 12/11/2020   T3FREE 3.3 09/08/2019   T3FREE 3.2 03/16/2019     Past Medical History:  Diagnosis Date   Migraine    Thyroid disease     No past surgical history on file.  Family History  Problem Relation Age of Onset   Diabetes Mother    Thyroid disease Mother    Diabetes Maternal Grandmother    Thyroid disease Maternal Grandmother     Social History:  reports that she has been smoking. She has never used smokeless tobacco. No history on file for alcohol use and drug use.  Allergies: No Known Allergies  Allergies as of 12/13/2020   No Known Allergies  Medication List        Accurate as of December 13, 2020  8:26 AM. If you have any questions, ask your nurse or doctor.          STOP taking these medications    HYDROcodone-acetaminophen 5-325 MG tablet Commonly known as: NORCO/VICODIN Stopped by: Reather Littler, MD   lidocaine 5 % Commonly known as: Lidoderm Stopped by: Reather Littler, MD       TAKE these medications    dexamethasone 4 MG tablet Commonly known as: DECADRON Take 1 tablet (4 mg total) by mouth 2 (two) times daily.   levothyroxine 88 MCG tablet Commonly known as: SYNTHROID Take 1 tablet (88 mcg total) by mouth daily.   liothyronine 5 MCG tablet Commonly known as: CYTOMEL TAKE 1 TABLET BY MOUTH  DAILY BEFORE BREAKFAST   methocarbamol 500 MG tablet Commonly known as: ROBAXIN Take 1 tablet (500 mg total) by mouth 3 (three) times daily.   sertraline 50 MG tablet Commonly known as: ZOLOFT Take 1 tablet by mouth daily.         Review of Systems  Has been on sertraline since at least 2019    Examination:    BP 100/72 (BP Location: Right Arm, Patient Position: Sitting, Cuff Size: Normal)   Pulse (!) 56   Ht 5\' 2"  (1.575 m)   Wt 178 lb 12.8 oz (81.1 kg)   SpO2 95%   BMI 32.70 kg/m     Assessment:  HYPOTHYROIDISM, Primary   She is has been on Cytomel 5 mcg and Synthroid 88 mcg since 3/20  She is very regular with her levothyroxine and Cytomel every morning  She does have some weight gain, hair loss and cold intolerance TSH is higher than usual at 13 without any change in her compliance with her supplements  Weight gain: This may be partly related to quitting smoking previously but may benefit from weight management program since she thinks she can do a little better with her diet.  Also may need pharmacological treatment  PLAN:  We will increase her levothyroxine to 112 and continue liothyronine 5 mcg Follow-up in 2 months Referral to weight management  4/20 12/13/2020, 8:26 AM

## 2020-12-13 ENCOUNTER — Other Ambulatory Visit: Payer: Self-pay

## 2020-12-13 ENCOUNTER — Ambulatory Visit: Payer: Managed Care, Other (non HMO) | Admitting: Endocrinology

## 2020-12-13 ENCOUNTER — Encounter: Payer: Self-pay | Admitting: Endocrinology

## 2020-12-13 VITALS — BP 100/72 | HR 56 | Ht 62.0 in | Wt 178.8 lb

## 2020-12-13 DIAGNOSIS — R635 Abnormal weight gain: Secondary | ICD-10-CM | POA: Diagnosis not present

## 2020-12-13 DIAGNOSIS — E063 Autoimmune thyroiditis: Secondary | ICD-10-CM | POA: Diagnosis not present

## 2020-12-13 MED ORDER — LEVOTHYROXINE SODIUM 112 MCG PO TABS: 112.0000 ug | ORAL_TABLET | Freq: Every day | ORAL | 2 refills | Status: DC

## 2021-01-17 ENCOUNTER — Other Ambulatory Visit: Payer: Self-pay

## 2021-01-17 MED ORDER — LIOTHYRONINE SODIUM 5 MCG PO TABS: 5.0000 ug | ORAL_TABLET | Freq: Every day | ORAL | 3 refills | Status: DC

## 2021-02-12 ENCOUNTER — Telehealth: Payer: Self-pay | Admitting: Endocrinology

## 2021-02-12 ENCOUNTER — Other Ambulatory Visit (INDEPENDENT_AMBULATORY_CARE_PROVIDER_SITE_OTHER): Payer: Managed Care, Other (non HMO)

## 2021-02-12 ENCOUNTER — Other Ambulatory Visit: Payer: Self-pay

## 2021-02-12 DIAGNOSIS — E063 Autoimmune thyroiditis: Secondary | ICD-10-CM | POA: Diagnosis not present

## 2021-02-12 LAB — T3, FREE: T3, Free: 3 pg/mL (ref 2.3–4.2)

## 2021-02-12 LAB — TSH: TSH: 0.63 u[IU]/mL (ref 0.35–5.50)

## 2021-02-12 LAB — T4, FREE: Free T4: 0.9 ng/dL (ref 0.60–1.60)

## 2021-02-12 NOTE — Telephone Encounter (Signed)
Pt has an upcoming app on 02/14/2021 and is wondering if this can be virtual, or either on the phone. If so patient would like a call back

## 2021-02-13 NOTE — Progress Notes (Signed)
Patient ID: Courtney Christensen, female   DOB: [birth year], 45 y.o.   MRN: [id number]            Reason for Appointment:  Hypothyroidism, f/u visit      History of Present Illness:   Hypothyroidism was first diagnosed in 2011  At the time of diagnosis patient was having symptoms of fatigue, decreased alertness, difficulties with memory, weight gain and hair loss She was told that her blood tests indicated hypothyroidism and was started on 50 g of levothyroxine. With starting thyroid supplementation the patient's symptoms improved and she was more alert and had improved energy level along with better weight.  She apparently felt fairly good until about middle of 2016 when she was starting to have more symptoms of low energy, sleepiness, difficulty with memory and weight gain as well as hair loss  Since her TSH was 3.27 in 8/16 her dose was increased from 50 up to 75 g; follow-up TSH was 2.27 with normal free T4 She thinks she felt only slightly better with this   She was seen in consultation in 3/17 when despite her normal TSH level she was complaining of fatigue, sleepiness, some hair loss and decreased memory and alertness and some continued weight gain  RECENT history: She had been on Cytomel 5 g daily along with levothyroxine since 3/17  In 08/2018 she was having progressive weight gain despite exercising more and a little more fatigue along with new cold intolerance Because of her TSH being higher in 6/22 her levothyroxine was further increased to 112 mcg  She does feels a little less tired compared to her last visit with increasing her levothyroxine by 25 mcg She is usually getting some hair thinning, not new May get cold easily as before  She takes her levothyroxine and liothyronine in the morning before breakfast She thinks she has not missed any doses Has not taken any supplements with calcium or iron  Her TSH which was high at 13 is now only 0.63  Free T4 was low normal  previously and now back to normal   Patient's weight history is as follows:  Wt Readings from Last 3 Encounters:  02/14/21 179 lb (81.2 kg)  12/13/20 178 lb 12.8 oz (81.1 kg)  09/28/19 169 lb (76.7 kg)    Thyroid function results have been as follows:  Lab Results  Component Value Date   TSH 0.63 02/12/2021   TSH 13.03 (H) 12/11/2020   TSH 1.84 09/08/2019   FREET4 0.90 02/12/2021   FREET4 0.63 12/11/2020   FREET4 1.03 09/08/2019    Lab Results  Component Value Date   T3FREE 3.0 02/12/2021   T3FREE 2.6 12/11/2020   T3FREE 3.3 09/08/2019     Past Medical History:  Diagnosis Date   Migraine    Thyroid disease     No past surgical history on file.  Family History  Problem Relation Age of Onset   Diabetes Mother    Thyroid disease Mother    Diabetes Maternal Grandmother    Thyroid disease Maternal Grandmother     Social History:  reports that she has been smoking. She has never used smokeless tobacco. No history on file for alcohol use and drug use.  Allergies: No Known Allergies  Allergies as of 02/14/2021   No Known Allergies      Medication List        Accurate as of February 14, 2021  8:18 AM. If you have any questions, ask your nurse or  doctor.          levothyroxine 112 MCG tablet Commonly known as: SYNTHROID Take 1 tablet (112 mcg total) by mouth daily.   liothyronine 5 MCG tablet Commonly known as: CYTOMEL Take 1 tablet (5 mcg total) by mouth daily before breakfast.   methocarbamol 500 MG tablet Commonly known as: ROBAXIN Take 1 tablet (500 mg total) by mouth 3 (three) times daily.   sertraline 50 MG tablet Commonly known as: ZOLOFT Take 1 tablet by mouth daily.         Review of Systems  Has been on sertraline since at least 2019    Examination:    BP 110/70 (BP Location: Left Arm, Patient Position: Sitting, Cuff Size: Normal)   Pulse 78   Ht 5\' 2"  (1.575 m)   Wt 179 lb (81.2 kg)   SpO2 99%   BMI 32.74 kg/m  Thyroid  nonpalpable Biceps reflexes appear normal    Assessment:  HYPOTHYROIDISM, Primary   She is has been on Cytomel 5 mcg and Synthroid 88 mcg  With her dosage increase recently she is feeling better Weight gain has leveled off TSH is back to normal  She does have some weight gain, hair loss and cold intolerance TSH is higher than usual at 13 without any change in her compliance with her supplements  Weight management: She was given the phone number to call for her referral which was already placed  PLAN:  No change in thyroid supplements and follow-up in 4 months to make sure her levels are stable  02/14/2021, 8:18 AM

## 2021-02-14 ENCOUNTER — Ambulatory Visit: Payer: Managed Care, Other (non HMO) | Admitting: Endocrinology

## 2021-02-14 ENCOUNTER — Encounter: Payer: Self-pay | Admitting: Endocrinology

## 2021-02-14 ENCOUNTER — Other Ambulatory Visit: Payer: Self-pay

## 2021-02-14 VITALS — BP 110/70 | HR 78 | Ht 62.0 in | Wt 179.0 lb

## 2021-02-14 DIAGNOSIS — E063 Autoimmune thyroiditis: Secondary | ICD-10-CM

## 2021-02-14 NOTE — Telephone Encounter (Signed)
Pt already had the in person appt today.

## 2021-03-01 ENCOUNTER — Encounter (INDEPENDENT_AMBULATORY_CARE_PROVIDER_SITE_OTHER): Payer: Self-pay

## 2021-03-07 ENCOUNTER — Other Ambulatory Visit: Payer: Self-pay

## 2021-03-07 DIAGNOSIS — E063 Autoimmune thyroiditis: Secondary | ICD-10-CM

## 2021-03-07 MED ORDER — LEVOTHYROXINE SODIUM 112 MCG PO TABS: 112.0000 ug | ORAL_TABLET | Freq: Every day | ORAL | 2 refills | Status: DC

## 2021-06-13 ENCOUNTER — Other Ambulatory Visit (INDEPENDENT_AMBULATORY_CARE_PROVIDER_SITE_OTHER): Payer: Managed Care, Other (non HMO)

## 2021-06-13 ENCOUNTER — Other Ambulatory Visit: Payer: Self-pay

## 2021-06-13 DIAGNOSIS — E063 Autoimmune thyroiditis: Secondary | ICD-10-CM | POA: Diagnosis not present

## 2021-06-13 LAB — TSH: TSH: 0.31 u[IU]/mL — ABNORMAL LOW (ref 0.35–5.50)

## 2021-06-13 LAB — T4, FREE: Free T4: 0.93 ng/dL (ref 0.60–1.60)

## 2021-06-20 ENCOUNTER — Ambulatory Visit: Payer: Managed Care, Other (non HMO) | Admitting: Endocrinology

## 2021-06-22 ENCOUNTER — Other Ambulatory Visit: Payer: Self-pay

## 2021-06-22 ENCOUNTER — Encounter: Payer: Self-pay | Admitting: Endocrinology

## 2021-06-22 ENCOUNTER — Telehealth (INDEPENDENT_AMBULATORY_CARE_PROVIDER_SITE_OTHER): Payer: Managed Care, Other (non HMO) | Admitting: Endocrinology

## 2021-06-22 DIAGNOSIS — E063 Autoimmune thyroiditis: Secondary | ICD-10-CM | POA: Diagnosis not present

## 2021-06-22 MED ORDER — LEVOTHYROXINE SODIUM 112 MCG PO TABS: 112.0000 ug | ORAL_TABLET | Freq: Every day | ORAL | 0 refills | Status: DC

## 2021-06-22 MED ORDER — LEVOTHYROXINE SODIUM 112 MCG PO TABS: 112.0000 ug | ORAL_TABLET | Freq: Every day | ORAL | 1 refills | Status: DC

## 2021-06-22 NOTE — Progress Notes (Signed)
Patient ID: Courtney Christensen, female   DOB: [birth year], 45 y.o.   MRN: [id number]            Reason for Appointment:  Hypothyroidism, f/u visit  I connected with the above-named patient by video enabled telemedicine application and verified that I am speaking with the correct person. The patient was explained the limitations of evaluation and management by telemedicine and the availability of in person appointments.  Patient also understood that there may be a patient responsible charge related to this service  Location of the patient: Patient's home  Location of the provider: Physician office Only the patient and myself were participating in the encounter The patient understood the above statements and agreed to proceed.     History of Present Illness:   Hypothyroidism was first diagnosed in 2011  At the time of diagnosis patient was having symptoms of fatigue, decreased alertness, difficulties with memory, weight gain and hair loss She was told that her blood tests indicated hypothyroidism and was started on 50 g of levothyroxine. With starting thyroid supplementation the patient's symptoms improved and she was more alert and had improved energy level along with better weight.  She apparently felt fairly good until about middle of 2016 when she was starting to have more symptoms of low energy, sleepiness, difficulty with memory and weight gain as well as hair loss  Since her TSH was 3.27 in 8/16 her dose was increased from 50 up to 75 g; follow-up TSH was 2.27 with normal free T4 She thinks she felt only slightly better with this   She was seen in consultation in 3/17 when despite her normal TSH level she was complaining of fatigue, sleepiness, some hair loss and decreased memory and alertness and some continued weight gain  RECENT history: She had been on Cytomel 5 g daily along with levothyroxine since 3/17  In 08/2018 she was having progressive weight gain despite exercising  more and a little more fatigue along with new cold intolerance Because of her TSH being higher in 6/22 her levothyroxine was further increased to 112 mcg With this her fatigue improved  Recently has had no unusual fatigue and feels fairly good overall Her weight is about 174 at home She has mild chronic thinning of the hair May get cold easily as before  She takes her levothyroxine and liothyronine in the morning before breakfast She thinks she has not missed any doses Has not taken any supplements with calcium or iron  Her TSH which was 0.63 is now 0.31 with about the same free T4 level   Patient's weight history is as follows:  Wt Readings from Last 3 Encounters:  02/14/21 179 lb (81.2 kg)  12/13/20 178 lb 12.8 oz (81.1 kg)  09/28/19 169 lb (76.7 kg)    Thyroid function results have been as follows:  Lab Results  Component Value Date   TSH 0.31 (L) 06/13/2021   TSH 0.63 02/12/2021   TSH 13.03 (H) 12/11/2020   FREET4 0.93 06/13/2021   FREET4 0.90 02/12/2021   FREET4 0.63 12/11/2020    Lab Results  Component Value Date   T3FREE 3.0 02/12/2021   T3FREE 2.6 12/11/2020   T3FREE 3.3 09/08/2019     Past Medical History:  Diagnosis Date   Migraine    Thyroid disease     No past surgical history on file.  Family History  Problem Relation Age of Onset   Diabetes Mother    Thyroid disease Mother  Diabetes Maternal Grandmother    Thyroid disease Maternal Grandmother     Social History:  reports that she has been smoking. She has never used smokeless tobacco. No history on file for alcohol use and drug use.  Allergies: No Known Allergies  Allergies as of 06/22/2021   No Known Allergies      Medication List        Accurate as of June 22, 2021  8:35 AM. If you have any questions, ask your nurse or doctor.          albuterol 108 (90 Base) MCG/ACT inhaler Commonly known as: VENTOLIN HFA Inhale into the lungs.   levothyroxine 112 MCG  tablet Commonly known as: SYNTHROID Take 1 tablet (112 mcg total) by mouth daily.   liothyronine 5 MCG tablet Commonly known as: CYTOMEL Take 1 tablet (5 mcg total) by mouth daily before breakfast.   methocarbamol 500 MG tablet Commonly known as: ROBAXIN Take 1 tablet (500 mg total) by mouth 3 (three) times daily.   sertraline 50 MG tablet Commonly known as: ZOLOFT Take 1 tablet by mouth daily.         Review of Systems  Has been on sertraline since at least 2019    Examination:    There were no vitals taken for this visit.  No exam done, virtual visit   Assessment:  HYPOTHYROIDISM, Primary   She has recently been on Cytomel 5 mcg and Synthroid 112 mcg  Thyroid levels have normalized since her levothyroxine was increased to 112 earlier this year She feels subjectively fairly good Also her weight is slightly improving slightly  TSH is now just below normal range  PLAN:   She will continue 112 mcg of levothyroxine but take only half tablet on Sundays while continuing 1 tablet on other days in the mornings Liothyronine will be continued unchanged  Reather Littler 06/22/2021, 8:35 AM

## 2021-12-25 ENCOUNTER — Other Ambulatory Visit: Payer: Self-pay | Admitting: Endocrinology

## 2022-01-15 ENCOUNTER — Other Ambulatory Visit: Payer: Self-pay | Admitting: Endocrinology

## 2022-01-15 DIAGNOSIS — E063 Autoimmune thyroiditis: Secondary | ICD-10-CM

## 2022-01-24 ENCOUNTER — Encounter: Payer: Self-pay | Admitting: Endocrinology

## 2022-01-24 ENCOUNTER — Other Ambulatory Visit: Payer: Self-pay

## 2022-01-24 DIAGNOSIS — E063 Autoimmune thyroiditis: Secondary | ICD-10-CM

## 2022-01-24 MED ORDER — LEVOTHYROXINE SODIUM 112 MCG PO TABS: 112.0000 ug | ORAL_TABLET | Freq: Every day | ORAL | 0 refills | Status: DC

## 2022-01-25 ENCOUNTER — Other Ambulatory Visit: Payer: Self-pay | Admitting: Endocrinology

## 2022-01-25 DIAGNOSIS — E063 Autoimmune thyroiditis: Secondary | ICD-10-CM

## 2022-02-06 ENCOUNTER — Encounter (INDEPENDENT_AMBULATORY_CARE_PROVIDER_SITE_OTHER): Payer: Self-pay

## 2022-03-14 ENCOUNTER — Telehealth: Payer: Self-pay | Admitting: Endocrinology

## 2022-03-14 NOTE — Telephone Encounter (Signed)
Patient is calling saying that she wants a sooner appointment because she had labs done at her primary care office and her levels were high.  Patient states that the labs can be viewed in her chart.

## 2022-03-15 NOTE — Telephone Encounter (Signed)
Patient sent a mychart message. So this is being handled through that encounter.

## 2022-03-26 ENCOUNTER — Telehealth: Payer: Self-pay | Admitting: Endocrinology

## 2022-03-26 DIAGNOSIS — E063 Autoimmune thyroiditis: Secondary | ICD-10-CM

## 2022-03-26 MED ORDER — LEVOTHYROXINE SODIUM 112 MCG PO TABS: 112.0000 ug | ORAL_TABLET | Freq: Every day | ORAL | 0 refills | Status: DC

## 2022-03-26 NOTE — Telephone Encounter (Signed)
RX now sent to preferred pharmacy.  

## 2022-03-26 NOTE — Telephone Encounter (Signed)
MEDICATION: Levothyroxine 112 mcg  PHARMACY:  [facility]  HAS THE PATIENT CONTACTED THEIR PHARMACY?  no  IS THIS A 90 DAY SUPPLY : unknown  IS PATIENT OUT OF MEDICATION: yes  IF NOT; HOW MUCH IS LEFT:   LAST APPOINTMENT DATE:@ 06/22/2021  NEXT APPOINTMENT DATE:@11 /22/2023  DO WE HAVE YOUR PERMISSION TO LEAVE A DETAILED MESSAGE?:  OTHER COMMENTS:    **Let patient know to contact pharmacy at the end of the day to make sure medication is ready. **  ** Please notify patient to allow 48-72 hours to process**  **Encourage patient to contact the pharmacy for refills or they can request refills through [facility]**

## 2022-04-01 NOTE — Telephone Encounter (Signed)
Error

## 2022-04-26 ENCOUNTER — Other Ambulatory Visit: Payer: Self-pay

## 2022-04-29 ENCOUNTER — Other Ambulatory Visit: Payer: Self-pay

## 2022-05-21 ENCOUNTER — Other Ambulatory Visit (INDEPENDENT_AMBULATORY_CARE_PROVIDER_SITE_OTHER): Payer: Managed Care, Other (non HMO)

## 2022-05-21 DIAGNOSIS — E063 Autoimmune thyroiditis: Secondary | ICD-10-CM | POA: Diagnosis not present

## 2022-05-21 LAB — TSH: TSH: 1.83 u[IU]/mL (ref 0.35–5.50)

## 2022-05-21 LAB — T4, FREE: Free T4: 0.76 ng/dL (ref 0.60–1.60)

## 2022-05-22 ENCOUNTER — Other Ambulatory Visit: Payer: Managed Care, Other (non HMO)

## 2022-05-30 ENCOUNTER — Ambulatory Visit: Payer: Managed Care, Other (non HMO) | Admitting: Endocrinology

## 2022-05-30 ENCOUNTER — Encounter: Payer: Self-pay | Admitting: Endocrinology

## 2022-05-30 VITALS — BP 110/72 | HR 61 | Ht 62.0 in | Wt 186.2 lb

## 2022-05-30 DIAGNOSIS — E063 Autoimmune thyroiditis: Secondary | ICD-10-CM | POA: Diagnosis not present

## 2022-05-30 DIAGNOSIS — R413 Other amnesia: Secondary | ICD-10-CM | POA: Diagnosis not present

## 2022-05-30 LAB — VITAMIN B12: Vitamin B-12: 1281 pg/mL — ABNORMAL HIGH (ref 211–911)

## 2022-05-30 NOTE — Progress Notes (Signed)
Patient ID: Courtney Christensen, female   DOB: 1976-06-26, 46 y.o.   MRN: [id number]            Reason for Appointment:  Hypothyroidism, f/u visit     History of Present Illness:   Hypothyroidism was first diagnosed in 2011  At the time of diagnosis patient was having symptoms of fatigue, decreased alertness, difficulties with memory, weight gain and hair loss She was told that her blood tests indicated hypothyroidism and was started on 50 g of levothyroxine. With starting thyroid supplementation the patient's symptoms improved and she was more alert and had improved energy level along with better weight.  She apparently felt fairly good until about middle of 2016 when she was starting to have more symptoms of low energy, sleepiness, difficulty with memory and weight gain as well as hair loss  Since her TSH was 3.27 in 8/16 her dose was increased from 50 up to 75 g; follow-up TSH was 2.27 with normal free T4 She thinks she felt only slightly better with this   She was seen in consultation in 3/17 when despite her normal TSH level she was complaining of fatigue, sleepiness, some hair loss and decreased memory and alertness and some continued weight gain  RECENT history: She had been on Cytomel 5 g daily along with levothyroxine since 08/2015  In 08/2018 she was having progressive weight gain despite exercising more and a little more fatigue along with new cold intolerance Because of her TSH being higher in 6/22 her levothyroxine was further increased to 112 mcg With this her fatigue improved  Only symptom now attributes to weight gain She also thinks that at times she has difficulty with memory Overall feels fairly good with no unusual fatigue  She has mild chronic thinning of the hair but no recent hair loss May get cold easily as before  She takes her levothyroxine and liothyronine in the morning before breakfast and has been quite regular She was told to leave off half a tablet  once a week on her last visit but she forgot  Not on supplements with calcium or iron  Her TSH which was 0.3 is now 1.8   Patient's weight history is as follows:  Wt Readings from Last 3 Encounters:  05/30/22 186 lb 3.2 oz (84.5 kg)  06/22/21 174 lb (78.9 kg)  02/14/21 179 lb (81.2 kg)    Thyroid function results have been as follows:  Lab Results  Component Value Date   TSH 1.83 05/21/2022   TSH 0.31 (L) 06/13/2021   TSH 0.63 02/12/2021   FREET4 0.76 05/21/2022   FREET4 0.93 06/13/2021   FREET4 0.90 02/12/2021    Lab Results  Component Value Date   T3FREE 3.0 02/12/2021   T3FREE 2.6 12/11/2020   T3FREE 3.3 09/08/2019     Past Medical History:  Diagnosis Date   Migraine    Thyroid disease     No past surgical history on file.  Family History  Problem Relation Age of Onset   Diabetes Mother    Thyroid disease Mother    Diabetes Maternal Grandmother    Thyroid disease Maternal Grandmother     Social History:  reports that she has quit smoking. Her smoking use included cigarettes. She started smoking about 3 years ago. She has never used smokeless tobacco. No history on file for alcohol use and drug use.  Allergies: No Known Allergies  Allergies as of 05/30/2022   No Known Allergies  Medication List        Accurate as of May 30, 2022 10:52 AM. If you have any questions, ask your nurse or doctor.          albuterol 108 (90 Base) MCG/ACT inhaler Commonly known as: VENTOLIN HFA Inhale into the lungs.   atorvastatin 10 MG tablet Commonly known as: LIPITOR Take 1 tablet by mouth daily.   levothyroxine 112 MCG tablet Commonly known as: SYNTHROID Take 1 tablet (112 mcg total) by mouth daily.   liothyronine 5 MCG tablet Commonly known as: CYTOMEL TAKE 1 TABLET DAILY BEFORE BREAKFAST   methocarbamol 500 MG tablet Commonly known as: ROBAXIN Take 1 tablet (500 mg total) by mouth 3 (three) times daily.   sertraline 50 MG  tablet Commonly known as: ZOLOFT Take 1 tablet by mouth daily.   SUMAtriptan 50 MG tablet Commonly known as: IMITREX TAKE 1 TABLET AT ONSET OF HEADACHE, MAY REPEAT ONCE IN 2 HOURS IF NEEDED         Review of Systems  Has been on sertraline since at least 2019  She thinks her weight gain is related to sedentary job  Her PCP has not evaluated for symptoms of reduced memory Low B12 level available   Examination:    BP 110/72   Pulse 61   Ht 5\' 2"  (1.575 m)   Wt 186 lb 3.2 oz (84.5 kg)   SpO2 98%   BMI 34.06 kg/m    Assessment:  HYPOTHYROIDISM, Primary   She has been on Cytomel 5 mcg and Synthroid 112 mcg  Thyroid levels are quite good now  She feels subjectively fairly good However her weight gain is unrelated to hypothyroidism   TSH is now quite normal  Unclear why she is having difficulties with memory, likely needs to have further evaluation  PLAN:   She will continue 112 mcg of levothyroxine with liothyronine as before Take this before breakfast consistently  Check B12 for memory loss  05/30/2022, 10:52 AM

## 2022-07-05 ENCOUNTER — Ambulatory Visit: Payer: Self-pay

## 2022-08-02 ENCOUNTER — Other Ambulatory Visit: Payer: Self-pay | Admitting: Endocrinology

## 2022-08-02 DIAGNOSIS — E063 Autoimmune thyroiditis: Secondary | ICD-10-CM

## 2023-04-15 ENCOUNTER — Other Ambulatory Visit: Payer: Self-pay

## 2023-04-15 DIAGNOSIS — E063 Autoimmune thyroiditis: Secondary | ICD-10-CM

## 2023-04-17 ENCOUNTER — Other Ambulatory Visit: Payer: Self-pay

## 2023-04-17 DIAGNOSIS — E063 Autoimmune thyroiditis: Secondary | ICD-10-CM

## 2023-04-17 MED ORDER — LIOTHYRONINE SODIUM 5 MCG PO TABS: 5.0000 ug | ORAL_TABLET | Freq: Every day | ORAL | 3 refills | Status: DC

## 2023-08-22 ENCOUNTER — Telehealth: Payer: Self-pay | Admitting: Endocrinology

## 2023-08-22 DIAGNOSIS — E063 Autoimmune thyroiditis: Secondary | ICD-10-CM

## 2023-08-22 MED ORDER — LEVOTHYROXINE SODIUM 112 MCG PO TABS: 112.0000 ug | ORAL_TABLET | Freq: Every day | ORAL | 0 refills | Status: DC

## 2023-08-22 MED ORDER — LIOTHYRONINE SODIUM 5 MCG PO TABS: 5.0000 ug | ORAL_TABLET | Freq: Every day | ORAL | 0 refills | Status: DC

## 2023-08-22 NOTE — Telephone Encounter (Signed)
 Sent prescriptions

## 2023-08-22 NOTE — Telephone Encounter (Signed)
 Patient called to schedule a follow up. She is a former patient of Dr Lucianne Muss and she scheduled with Dr Erroll Luna. She needs a refill for levothyroxine (SYNTHROID) 112 MCG tablet and said she is completely out and has been for several days but that the pharmacy told her to call us.   [id number] - [place], [place] - [address]

## 2023-09-10 ENCOUNTER — Ambulatory Visit: Payer: Managed Care, Other (non HMO) | Admitting: Endocrinology

## 2023-09-10 ENCOUNTER — Encounter: Payer: Self-pay | Admitting: Endocrinology

## 2023-09-10 VITALS — BP 116/70 | HR 76 | Resp 16 | Ht 62.0 in | Wt 167.6 lb

## 2023-09-10 DIAGNOSIS — E039 Hypothyroidism, unspecified: Secondary | ICD-10-CM

## 2023-09-10 DIAGNOSIS — E063 Autoimmune thyroiditis: Secondary | ICD-10-CM

## 2023-09-10 NOTE — Progress Notes (Unsigned)
 Outpatient Endocrinology Note [place] Marquisa Salih, MD   Patient's Name: Courtney Christensen    DOB: 1975-09-06    MRN: [id number]  REASON OF VISIT: Follow-up for hypothyroidism  REFERRING PROVIDER:   PCP: [facility]  HISTORY OF PRESENT ILLNESS:   Courtney Christensen is a 48 y.o. old female with past medical history as listed below is presented for a follow up /*** new consult for hypothyroidism.   Pertinent Thyroid History: - Patient was diagnosed with hypothyroidism  in .  - Patient had symptoms of fatigue, hair issues, constipation, weight gain, cold intolerance and had thyroid function which were consistent with hypothyroidism.  - Patient has been taking levothyroxine since the diagnosis.  - No anterior neck compressive symptoms. - No family history of thyroid disorder. - US thyroid in *** showed  - Labs: Patient had AntiTPO Ab positive.   No specialty comments available.   Interval history Patient is adherent with  levothyroxine. Patient is currently taking Levothyroxine **** mcg daily.  Patient denies skipping any doses. No fatigue, weight changes. No palpitations, excessive sweating, heat or cold intolerance. No nausea, vomiting, diarrhea or constipation.  No pedal edema, no hand tremors and no skin and hair changes. No breathing difficulty, choking or dysphagia or noticeable masses in the neck.  ******** Patient has regular monthly menstrual cycles. No proptosis, redness, irritation and watering of eyes.   Thyroid medications: @THYROIDDRUGS @   No complaints.  REVIEW OF SYSTEMS:  As per history of present illness.   PAST MEDICAL HISTORY: Past Medical History:  Diagnosis Date   Migraine    Thyroid disease     PAST SURGICAL HISTORY: History reviewed. No pertinent surgical history.  ALLERGIES: No Known Allergies  FAMILY HISTORY:  Family History  Problem Relation Age of Onset   Diabetes Mother    Thyroid disease Mother    Diabetes Maternal  Grandmother    Thyroid disease Maternal Grandmother     SOCIAL HISTORY: Social History   Socioeconomic History   Marital status: Married    Spouse name: Not on file   Number of children: Not on file   Years of education: Not on file   Highest education level: Not on file  Occupational History   Not on file  Tobacco Use   Smoking status: Former    Types: Cigarettes    Start date: 06/14/2018   Smokeless tobacco: Never  Substance and Sexual Activity   Alcohol use: Not on file   Drug use: Not on file   Sexual activity: Not on file  Other Topics Concern   Not on file  Social History Narrative   ** Merged History Encounter **       Social Drivers of Health   Financial Resource Strain: Low Risk  (02/27/2022)   Received from [facility] visits prior to 08/31/2022., [facility] visits prior to 08/31/2022., [facility]   Overall Financial Resource Strain (CARDIA)    Difficulty of Paying Living Expenses: Not very hard  Food Insecurity: Low Risk  (03/04/2023)   Received from [facility]   Hunger Vital Sign    Worried About Running Out of Food in the Last Year: Never true    Ran Out of Food in the Last Year: Never true  Transportation Needs: No Transportation Needs (03/04/2023)   Received from [facility]    In the past 12 months, has lack of reliable transportation kept you from medical appointments, meetings,  work or from getting things needed for daily living? : No  Physical Activity: Insufficiently Active (02/27/2022)   Received [facility] visits prior to 08/31/2022., [facility] visits prior to 08/31/2022., [facility]   Exercise Vital Sign    Days of Exercise per Week: 2 days    Minutes of Exercise per Session: 30 min  Stress: No Stress Concern Present (02/27/2022)   [facility] prior to 08/31/2022., [facility] visits prior to 08/31/2022., [facility]   [employer] of Occupational Health - Occupational Stress Questionnaire    Feeling of Stress : Only a little  Social Connections: Moderately Isolated (02/27/2022)   Received from [facility] visits prior to 08/31/2022., [facility] visits prior to 08/31/2022., [facility]   Social Connection and Isolation Panel [NHANES]    Frequency of Communication with Friends and Family: Twice a week    Frequency of Social Gatherings with Friends and Family: Once a week    Attends Religious Services: Never    Database administrator or Organizations: No    Attends Engineer, structural: Never    Marital Status: Married    MEDICATIONS:  Current Outpatient Medications  Medication Sig Dispense Refill   albuterol (VENTOLIN HFA) 108 (90 Base) MCG/ACT inhaler Inhale into the lungs.     atorvastatin (LIPITOR) 10 MG tablet Take 1 tablet by mouth daily.     levothyroxine (SYNTHROID) 112 MCG tablet Take 1 tablet (112 mcg total) by mouth daily. 90 tablet 0   liothyronine (CYTOMEL) 5 MCG tablet Take 1 tablet (5 mcg total) by mouth daily before breakfast. 90 tablet 0   methocarbamol (ROBAXIN) 500 MG tablet Take 1 tablet (500 mg total) by mouth 3 (three) times daily. 15 tablet 0   sertraline (ZOLOFT) 50 MG tablet Take 100 mg by mouth daily.     SUMAtriptan (IMITREX) 50 MG tablet TAKE 1 TABLET AT ONSET OF HEADACHE, MAY REPEAT ONCE IN 2 HOURS IF NEEDED     No current facility-administered medications for this visit.    PHYSICAL EXAM: Vitals:   09/10/23 1401  BP: 116/70  Pulse: 76  Resp: 16  SpO2: 97%  Weight: 167 lb 9.6 oz (76 kg)  Height: 5\' 2"  (1.575 m)   Body mass index is 30.65 kg/m.  Wt Readings from Last 3 Encounters:  09/10/23 167 lb 9.6 oz (76 kg)  05/30/22 186 lb 3.2 oz (84.5 kg)  06/22/21 174 lb (78.9 kg)    General: Well developed, well nourished female in no apparent distress.  HEENT:  AT/[place], no external lesions. Hearing intact to the spoken word Eyes: EOMI. No stare, proptosis or lid lag. Conjunctiva clear and no icterus. Neck: Trachea midline, neck supple without appreciable thyromegaly or lymphadenopathy and no palpable thyroid nodules Lungs: Clear to auscultation, no wheeze. Respirations not labored Heart: S1S2, Regular in rate and rhythm. No loud murmurs Abdomen: Soft, non tender, non distended, no masses, no striae Neurologic: Alert, oriented, normal speech, deep tendon biceps reflexes normal,  no gross focal neurological deficit Extremities: No pedal pitting edema, no tremors of outstretched hands Skin: Warm, color good. No sores or rashes noted Psychiatric: Does not appear depressed or anxious  PERTINENT HISTORIC LABORATORY AND IMAGING STUDIES:  All pertinent laboratory results were reviewed. Please see HPI also for further details.   TSH  Date Value Ref Range Status  05/21/2022  1.83 0.35 - 5.50 uIU/mL Final  06/13/2021 0.31 (L) 0.35 - 5.50 uIU/mL Final  02/12/2021 0.63 0.35 - 5.50 uIU/mL Final     ASSESSMENT / PLAN  No diagnosis found.    We discussed the medical need for compliance with levothyroxine therapy, that it is a hormone necessary for life, and that serious consequences may result from noncompliance. Discussed the proper method of levothyroxine administration: take on an empty stomach in the morning, with water, waiting thirty to sixty minutes before taking any other beverages or food. Also reviewed the need to take calcium or iron supplements or multivitamin (that may contain iron or calcium) at least 4 hours after levothyroxine administration.  *** Hashimoto's thyroiditis is caused by antibodies that attack the thyroid and destroy it. Approximately 90% of people with Hashimoto's thyroiditis have positive TPO antibodies, and about 50% have positive thyroglobulin antibodies. There are about 5% of patients that have no antibodies but diagnosed  based on clinical and ultrasound features. Criteria for antibody negative Hashimoto's thyroiditis: 1.An ultrasound showing the characteristic hypoechoic pattern of Hashimoto's thyroiditis 2.two blood TSH levels >4.0 within 2-6 months of each other and 3. The absence of serum TPO or thyroglobulin antibodies on two occasions    Rotondi M, de Martinis L, Coperchini F, Pignatti P, Pirali B, Ghilotti S, Fonte R, Magri F, Chiovato L. Serum negative autoimmune thyroiditis displays a milder clinical picture compared with classic Hashimoto's thyroiditis. Eur J Endocrinol. 2014   Treatment:(American thyroid association) Generally treatment for Hashimoto's thyroiditis depends on your TSH and  free T4 level  -Patients with elevated TPO antibodies but normal thyroid function tests (TSH and Free T4) do not require treatment.  -Patient with only a slightly elevated TSH (mild hypothyroidism) may not require medication and should have repeat testing after 3-6 months. -Patients with overt hypothyroidism (elevated TSH and low thyroid hormone levels) treatment consists of thyroid hormone replacement     There are no diagnoses linked to this encounter.  DISPOSITION Follow up in clinic in *** months suggested.  All questions answered and patient verbalized understanding of the plan.  [place] Verlinda Slotnick, MD [facility] Endocrinology [facility] [address], [address] [place], [place] [address] Phone # [phone]  At least part of this note was generated using voice recognition software. Inadvertent word errors may have occurred, which were not recognized during the proofreading process.

## 2023-09-11 ENCOUNTER — Encounter: Payer: Self-pay | Admitting: Endocrinology

## 2023-09-11 LAB — TSH: TSH: 0.14 m[IU]/L — ABNORMAL LOW

## 2023-09-11 LAB — T4, FREE: Free T4: 1.2 ng/dL (ref 0.8–1.8)

## 2023-09-11 LAB — T3, FREE: T3, Free: 3.4 pg/mL (ref 2.3–4.2)

## 2023-09-11 MED ORDER — LEVOTHYROXINE SODIUM 112 MCG PO TABS: 112.0000 ug | ORAL_TABLET | Freq: Every day | ORAL | 3 refills | Status: AC

## 2023-09-11 MED ORDER — LIOTHYRONINE SODIUM 5 MCG PO TABS: 5.0000 ug | ORAL_TABLET | Freq: Every day | ORAL | 3 refills | Status: AC

## 2023-09-11 NOTE — Addendum Note (Signed)
 Addended by: Makalynn Berwanger, [place] on: 09/11/2023 11:39 PM   Modules accepted: Orders

## 2024-09-09 ENCOUNTER — Ambulatory Visit: Admitting: Endocrinology
